# Patient Record
Sex: Male | Born: 1955 | Race: Black or African American | Hispanic: No | Marital: Single | Smoking: Never smoker
Health system: Southern US, Community
[De-identification: ages and names within clinical notes are randomized; demographics above are authoritative.]

## PROBLEM LIST (undated history)

## (undated) DIAGNOSIS — I10 Essential (primary) hypertension: Secondary | ICD-10-CM

## (undated) HISTORY — PX: KNEE SURGERY: SHX244

---

## 2003-11-27 ENCOUNTER — Ambulatory Visit (HOSPITAL_COMMUNITY): Admission: RE | Admit: 2003-11-27 | Discharge: 2003-11-27 | Payer: Self-pay | Admitting: Pulmonary Disease

## 2003-12-09 ENCOUNTER — Ambulatory Visit (HOSPITAL_COMMUNITY): Admission: RE | Admit: 2003-12-09 | Discharge: 2003-12-09 | Payer: Self-pay | Admitting: Pulmonary Disease

## 2003-12-15 ENCOUNTER — Emergency Department (HOSPITAL_COMMUNITY): Admission: EM | Admit: 2003-12-15 | Discharge: 2003-12-15 | Payer: Self-pay | Admitting: Emergency Medicine

## 2003-12-18 ENCOUNTER — Ambulatory Visit (HOSPITAL_COMMUNITY): Admission: RE | Admit: 2003-12-18 | Discharge: 2003-12-18 | Payer: Self-pay | Admitting: Orthopedic Surgery

## 2003-12-23 ENCOUNTER — Encounter (HOSPITAL_COMMUNITY): Admission: RE | Admit: 2003-12-23 | Discharge: 2004-01-22 | Payer: Self-pay | Admitting: Orthopedic Surgery

## 2004-01-25 ENCOUNTER — Encounter (HOSPITAL_COMMUNITY): Admission: RE | Admit: 2004-01-25 | Discharge: 2004-02-24 | Payer: Self-pay | Admitting: Orthopedic Surgery

## 2007-05-18 ENCOUNTER — Emergency Department (HOSPITAL_COMMUNITY): Admission: EM | Admit: 2007-05-18 | Discharge: 2007-05-18 | Payer: Self-pay | Admitting: Emergency Medicine

## 2011-01-22 NOTE — Op Note (Signed)
NAME:  William Barajas, William Barajas                        ACCOUNT NO.:  [id number]   MEDICAL RECORD NO.:  [id number]                   PATIENT TYPE:  AMB   LOCATION:  DAY                                  FACILITY:  [facility]   PHYSICIAN:  Vickki Hearing, M.D.           DATE OF BIRTH:  May 08, 1956   DATE OF PROCEDURE:  12/18/2003  DATE OF DISCHARGE:                                 OPERATIVE REPORT   PREOPERATIVE DIAGNOSIS:  Medial meniscal tear right knee.   POSTOPERATIVE DIAGNOSIS:  Medial meniscal tear right knee.   PROCEDURE:  1. Arthroscopy.  2. Medial meniscectomy, partial.   FINDINGS:  Torn medial meniscus.   SURGEON:  Vickki Hearing, M.D.   ANESTHETIC:  General.   DESCRIPTION OF PROCEDURE:  Mr. Mault was identified in the holding area.  His right knee was marked for surgery.  His consent and medical record were  reviewed.  He was given Ancef; taken to the operating room; and a general  anesthetic was administered.  His right knee was prepped and draped in a  sterile technique.   A time out was taken; everyone agreed with the procedure and extremity and  patient.  We proceeded with a diagnostic arthroscopic.  We viewed all  compartments of the knee; palpated all structures.  The medial meniscus was  torn.  A straight duckbill forceps was used to resect the tear.  The  meniscus was then balanced with a straight shaver.  We also used an  ArthroCare wand to contour the meniscus.   The knee was irrigated, suctioned, and the portals were closed with Steri-  Strips. A sterile dressing was applied along with the CryoCuff.  He was  extubated and taken to the recovery room in stable condition.   POSTOPERATIVE PLAN:  1. Full weightbearing.  2. Start physical therapy in 2-3 days.  3. Follow up in 2 days.      ___________________________________________                                            Vickki Hearing, M.D.   SEH/MEDQ  D:  12/19/2003  T:  12/19/2003  Job:   [id number]

## 2011-01-22 NOTE — H&P (Signed)
NAME:  William Barajas, PAREKH NO.:  [id number]   MEDICAL RECORD NO.:  [id number]                  PATIENT TYPE:   LOCATION:                                       FACILITY:   PHYSICIAN:  Vickki Hearing, M.D.           DATE OF BIRTH:  23-Sep-1955   DATE OF ADMISSION:  DATE OF DISCHARGE:                                HISTORY & PHYSICAL   CHIEF COMPLAINT:  Right knee pain.   This is a Short-Stay record for History and Physical for surgery.   MEDICAL HISTORY RELATED TO HIS ILLNESS:  This is a 55 year old male employee  of [employer], has atraumatic onset of right knee pain, swelling  and decreased function.  MRI was obtained which showed a torn medial  meniscus, some subcortical bony changes of the medial femoral condyle most  likely associated with arthritis, small joint effusion, grade 2  chondromalacia of the patella.  He presented for arthroscopy after failed  nonoperative treatment.   Other medical history, general medical history, review of systems negative.  NO ALLERGIES.  No medical problems or surgery.   MEDICATIONS:  Currently he is on  some hydrocodone and naproxen for this  illness.   FAMILY HISTORY:  Unrecorded.   FAMILY PHYSICIAN:  Dr. Juanetta Gosling.   SOCIAL HISTORY:  He is single, he does housekeeping.  He does not smoke, or  drink caffeine, __________.  Highest grade completed 11.   PHYSICAL EXAMINATION:  Weight is 160.  VITAL SIGNS:  Will be recorded at the time of surgery.  HEENT:  Reveals poor dentition and hygiene.  NECK:  Supple.  CHEST:  Clear.  HEART:  Rate and rhythm normal.  ABDOMEN:  Deferred.  RIGHT KNEE:  Range of motion 10-110, joint effusion noted, medial joint line  tenderness, positive screw home test.  Pain with external rotation of the  knee.  Knee ligaments are stable.  UPPER EXTREMITIES:  Are normal.   MRI FINDINGS:  As stated.   DIAGNOSIS:  Medial meniscal tear.   PLAN:  Arthroscopy right knee.   DATE OF SURGERY:  Wednesday, December 18, 2003.   FOLLOW UP:  Scheduled December 20, 2003.   PLANNED PROCEDURE CODE:  04540.   DIAGNOSIS CODES:  1. 717.2.  2. 715.16.     ___________________________________________                                         Vickki Hearing, M.D.   SEH/MEDQ  D:  12/16/2003  T:  12/16/2003  Job:  [id number]   cc:   Ramon Dredge L. Juanetta Gosling, M.D.  [address]  [place]  [place] [address]  Fax: [phone]   Day Surgery

## 2011-02-21 ENCOUNTER — Emergency Department (HOSPITAL_COMMUNITY)
Admission: EM | Admit: 2011-02-21 | Discharge: 2011-02-21 | Disposition: A | Discharge status: Home / Self Care | Payer: Commercial Managed Care - PPO | Attending: Emergency Medicine | Admitting: Emergency Medicine

## 2011-02-21 ENCOUNTER — Emergency Department (HOSPITAL_COMMUNITY): Payer: Commercial Managed Care - PPO

## 2011-02-21 DIAGNOSIS — M169 Osteoarthritis of hip, unspecified: Secondary | ICD-10-CM | POA: Insufficient documentation

## 2011-02-21 DIAGNOSIS — M1610 Unilateral primary osteoarthritis, unspecified hip: Secondary | ICD-10-CM | POA: Insufficient documentation

## 2011-02-24 ENCOUNTER — Other Ambulatory Visit (HOSPITAL_COMMUNITY): Payer: Self-pay | Admitting: Pulmonary Disease

## 2011-02-24 ENCOUNTER — Ambulatory Visit (HOSPITAL_COMMUNITY)
Admission: RE | Admit: 2011-02-24 | Discharge: 2011-02-24 | Disposition: A | Discharge status: Home / Self Care | Payer: 59 | Source: Ambulatory Visit | Attending: Pulmonary Disease | Admitting: Pulmonary Disease

## 2011-02-24 ENCOUNTER — Other Ambulatory Visit (HOSPITAL_COMMUNITY): Payer: Commercial Managed Care - PPO

## 2011-02-24 DIAGNOSIS — M79606 Pain in leg, unspecified: Secondary | ICD-10-CM

## 2011-02-24 DIAGNOSIS — M545 Low back pain, unspecified: Secondary | ICD-10-CM

## 2011-02-24 DIAGNOSIS — M25559 Pain in unspecified hip: Secondary | ICD-10-CM | POA: Insufficient documentation

## 2011-02-24 DIAGNOSIS — M79609 Pain in unspecified limb: Secondary | ICD-10-CM | POA: Insufficient documentation

## 2011-02-24 DIAGNOSIS — M5126 Other intervertebral disc displacement, lumbar region: Secondary | ICD-10-CM | POA: Insufficient documentation

## 2011-02-25 ENCOUNTER — Other Ambulatory Visit (HOSPITAL_COMMUNITY): Payer: Self-pay

## 2011-02-25 ENCOUNTER — Other Ambulatory Visit (HOSPITAL_COMMUNITY): Payer: Commercial Managed Care - PPO

## 2012-10-28 ENCOUNTER — Emergency Department (HOSPITAL_COMMUNITY): Payer: 59

## 2012-10-28 ENCOUNTER — Emergency Department (HOSPITAL_COMMUNITY)
Admission: EM | Admit: 2012-10-28 | Discharge: 2012-10-28 | Disposition: A | Discharge status: Home / Self Care | Payer: 59 | Attending: Emergency Medicine | Admitting: Emergency Medicine

## 2012-10-28 ENCOUNTER — Encounter (HOSPITAL_COMMUNITY): Payer: Self-pay | Admitting: Emergency Medicine

## 2012-10-28 DIAGNOSIS — M25519 Pain in unspecified shoulder: Secondary | ICD-10-CM | POA: Insufficient documentation

## 2012-10-28 DIAGNOSIS — Z9889 Other specified postprocedural states: Secondary | ICD-10-CM | POA: Insufficient documentation

## 2012-10-28 MED ORDER — HYDROCODONE-ACETAMINOPHEN 5-325 MG PO TABS: ORAL_TABLET | ORAL | Status: DC

## 2012-10-28 NOTE — ED Notes (Signed)
Patient c/o left shoulder pain that radiates down left arm. Per patient seen Dr Juanetta Gosling and given prednisone pack for pulled muscle, ion which he denies any improvement. Denies any chest pain or shortness of breath.

## 2012-10-28 NOTE — ED Provider Notes (Signed)
History     CSN: [id number]  Arrival date & time 10/28/12  1053   First MD Initiated Contact with Patient 10/28/12 1117      Chief Complaint  Patient presents with  . Shoulder Pain    (Consider location/radiation/quality/duration/timing/severity/associated sxs/prior treatment) HPI Comments: patient c/o pain to his left posterior shoulder for several days.  States that the pain radiates into his left upper arm as well.  Pain is worse with certain movements of the arm and improves with rest.  He states he saw his PMD for this 4 days ago and was given a prescription for prednisone which he has been taking , but has not improved the pain.  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  He denies neck pain, chest pain, shortness of breath, swelling , numbness or weakness to the extremity.    Patient is a 57 y.o. male presenting with shoulder pain. The history is provided by the patient.  Shoulder Pain This is a new problem. The current episode started 1 to 4 weeks ago. The problem occurs constantly. The problem has been unchanged. Associated symptoms include arthralgias. Pertinent negatives include no abdominal pain, chest pain, chills, congestion, coughing, diaphoresis, fever, headaches, joint swelling, myalgias, nausea, neck pain, numbness, rash, swollen glands, vomiting or weakness. The symptoms are aggravated by bending (certain movements and palaption). He has tried position changes (prednisone) for the symptoms. The treatment provided no relief.    History reviewed. No pertinent past medical history.  Past Surgical History  Procedure Laterality Date  . Knee surgery Right     Family History  Problem Relation Age of Onset  . Cancer Mother     History  Substance Use Topics  . Smoking status: Never Smoker   . Smokeless tobacco: Never Used  . Alcohol Use: No      Review of Systems  Constitutional: Negative for  fever, chills and diaphoresis.  HENT: Negative for congestion, neck pain and neck stiffness.   Respiratory: Negative for cough.   Cardiovascular: Negative for chest pain and palpitations.  Gastrointestinal: Negative for nausea, vomiting and abdominal pain.  Genitourinary: Negative for dysuria and difficulty urinating.  Musculoskeletal: Positive for arthralgias. Negative for myalgias, back pain and joint swelling.  Skin: Negative for color change, rash and wound.  Neurological: Negative for dizziness, syncope, facial asymmetry, weakness, numbness and headaches.  All other systems reviewed and are negative.    Allergies  Review of patient's allergies indicates no known allergies.  Home Medications   Current Outpatient Rx  Name  Route  Sig  Dispense  Refill  . predniSONE (DELTASONE) 10 MG tablet   Oral   Take 10-40 mg by mouth daily. Take 4 tablets for 3 days, then 3 tablets for 3 days, then 2 tablets for 3 days, then 1 tablet for 3 days. Started on 10/24/12.           BP 180/104  Pulse 79  Temp(Src) 97.3 F (36.3 C) (Oral)  Resp 16  Ht 5\' 6"  (1.676 m)  Wt 190 lb (86.183 kg)  BMI 30.68 kg/m2  SpO2 100%  Physical Exam  Nursing note and vitals reviewed. Constitutional: He is oriented to person, place, and time. He appears well-developed and well-nourished. No distress.  HENT:  Head: Normocephalic and atraumatic.  Neck: Normal range of motion. Neck supple. No thyromegaly present.  Cardiovascular: Normal rate, regular rhythm, normal heart  sounds and intact distal pulses.   No murmur heard. Pulmonary/Chest: Effort normal and breath sounds normal. No respiratory distress. He exhibits no tenderness.  Musculoskeletal: He exhibits tenderness. He exhibits no edema.  ttp of the left AC joint of the  Shoulder and tenderness along the muscular border along the scapula.  Pain with abduction of the left arm and rotation of the shoulder.  Radial pulse is brisk, distal sensation intact,  CR< 2 sec.  No abrasions, edema or bony deformity of the joint.   Lymphadenopathy:    He has no cervical adenopathy.  Neurological: He is alert and oriented to person, place, and time. He exhibits normal muscle tone. Coordination normal.  Skin: Skin is warm and dry.    ED Course  Procedures (including critical care time)  Labs Reviewed - No data to display Dg Shoulder Left  10/28/2012  *RADIOLOGY REPORT*  Clinical Data: Left shoulder pain.  LEFT SHOULDER - 2+ VIEW  Comparison: None.  Findings: No acute fracture or dislocation is identified.  Mild proliferative changes are seen involving the acromioclavicular joint with acromial spur present.  Soft tissues are unremarkable. No bony lesions or destruction.  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   Original Report Authenticated By: Irish Lack, M.D.         MDM  Vital stable, pt is well appearing.  Pain to shoulder reproduced with movement.  No erythema, excessive warmth or edema of the joint.   1300  Consulted Dr. Romeo Apple at patient's request.  Dr. Romeo Apple will see him in his office for f/u.      Will have the patient continue his prednisone taper and I will also prescribe norco #20 for better pain control.  Pain likely muscloskeletal, doubt neurological , infectious or cardiac related process    Nakshatra Klose L. Shannara Winbush, [place] 10/30/12 1708

## 2012-10-30 ENCOUNTER — Ambulatory Visit (INDEPENDENT_AMBULATORY_CARE_PROVIDER_SITE_OTHER): Payer: 59 | Admitting: Orthopedic Surgery

## 2012-10-30 ENCOUNTER — Encounter: Payer: Self-pay | Admitting: Orthopedic Surgery

## 2012-10-30 VITALS — BP 180/92 | Ht 66.0 in | Wt 197.4 lb

## 2012-10-30 DIAGNOSIS — M7550 Bursitis of unspecified shoulder: Secondary | ICD-10-CM | POA: Insufficient documentation

## 2012-10-30 DIAGNOSIS — M7552 Bursitis of left shoulder: Secondary | ICD-10-CM

## 2012-10-30 DIAGNOSIS — M67919 Unspecified disorder of synovium and tendon, unspecified shoulder: Secondary | ICD-10-CM

## 2012-10-30 DIAGNOSIS — M719 Bursopathy, unspecified: Secondary | ICD-10-CM

## 2012-10-30 HISTORY — DX: Bursitis of unspecified shoulder: M75.50

## 2012-10-30 NOTE — Progress Notes (Signed)
Patient ID: William Barajas, male   DOB: 05-12-1956, 57 y.o.   MRN: [id number] Chief Complaint  Patient presents with  . Shoulder Pain    left shoulder pain, no injury    Patient reports sudden onset of pain in his left shoulder on 10/24/2012 he saw his primary care physician and eventually went to the ER. He says the prednisone did help relieve some of the pain is also placed on hydrocodone after the ER visit  He complains of sharp 10 out of 10 constant pain worse in the morning worse with raising his arm associated with some radiation of pain into his elbow and swelling no trauma  Review of systems negative except for what was stated  BP 180/92  Ht 5\' 6"  (1.676 m)  Wt 197 lb 6.4 oz (89.54 kg)  BMI 31.88 kg/m2 Physical Exam(12)  Vital signs:   GENERAL: normal development   CDV: pulses are normal   Skin: normal  Lymph: nodes were not palpable/normal  Psychiatric: awake, alert and oriented  Neuro: normal sensation  MSK  Gait: Normal 1 Inspection cervical spine nontender left shoulder tender around the acromial area and deltoid 2 Range of Motion normal passive range of motion with pain at 120 of flexion throughout the ARC of 180 with a positive impingement sign 3 Motor normal cuff strength 4 Stability normal stability tests  Other side:  5 normal right shoulder range of motion 6 normal right shoulder strength  Imaging hospital film was normal except for some mild a.c. joint arthrosis report reviewed    Assessment: Right shoulder bursitis    Plan: Injection rest follow up as needed  Subacromial Shoulder Injection Procedure Note  Pre-operative Diagnosis: left RC Syndrome  Post-operative Diagnosis: same  Indications: pain   Anesthesia: ethyl chloride   Procedure Details   Verbal consent was obtained for the procedure. The shoulder was prepped withalcohol and the skin was anesthetized. A 20 gauge needle was advanced into the subacromial space through  posterior approach without difficulty  The space was then injected with 3 ml 1% lidocaine and 1 ml of depomedrol. The injection site was cleansed with isopropyl alcohol and a dressing was applied.  Complications:  None; patient tolerated the procedure well.

## 2012-10-30 NOTE — ED Provider Notes (Signed)
Medical screening examination/treatment/procedure(s) were performed by non-physician practitioner and as supervising physician I was immediately available for consultation/collaboration.   Laray Anger, DO 10/30/12 1940

## 2012-10-30 NOTE — Patient Instructions (Addendum)
Return to work on the 3rd of March Impingement Syndrome, Rotator Cuff, Bursitis with Rehab Impingement syndrome is a condition that involves inflammation of the tendons of the rotator cuff and the subacromial bursa, that causes pain in the shoulder. The rotator cuff consists of four tendons and muscles that control much of the shoulder and upper arm function. The subacromial bursa is a fluid filled sac that helps reduce friction between the rotator cuff and one of the bones of the shoulder (acromion). Impingement syndrome is usually an overuse injury that causes swelling of the bursa (bursitis), swelling of the tendon (tendonitis), and/or a tear of the tendon (strain). Strains are classified into three categories. Grade 1 strains cause pain, but the tendon is not lengthened. Grade 2 strains include a lengthened ligament, due to the ligament being stretched or partially ruptured. With grade 2 strains there is still function, although the function may be decreased. Grade 3 strains include a complete tear of the tendon or muscle, and function is usually impaired. SYMPTOMS   Pain around the shoulder, often at the outer portion of the upper arm.  Pain that gets worse with shoulder function, especially when reaching overhead or lifting.  Sometimes, aching when not using the arm.  Pain that wakes you up at night.  Sometimes, tenderness, swelling, warmth, or redness over the affected area.  Loss of strength.  Limited motion of the shoulder, especially reaching behind the back (to the back pocket or to unhook bra) or across your body.  Crackling sound (crepitation) when moving the arm.  Biceps tendon pain and inflammation (in the front of the shoulder). Worse when bending the elbow or lifting. CAUSES  Impingement syndrome is often an overuse injury, in which chronic (repetitive) motions cause the tendons or bursa to become inflamed. A strain occurs when a force is paced on the tendon or muscle that  is greater than it can withstand. Common mechanisms of injury include: Stress from sudden increase in duration, frequency, or intensity of training.  Direct hit (trauma) to the shoulder.  Aging, erosion of the tendon with normal use.  Bony bump on shoulder (acromial spur). RISK INCREASES WITH:  Contact sports (football, wrestling, boxing).  Throwing sports (baseball, tennis, volleyball).  Weightlifting and bodybuilding.  Heavy labor.  Previous injury to the rotator cuff, including impingement.  Poor shoulder strength and flexibility.  Failure to warm up properly before activity.  Inadequate protective equipment.  Old age.  Bony bump on shoulder (acromial spur). PREVENTION   Warm up and stretch properly before activity.  Allow for adequate recovery between workouts.  Maintain physical fitness:  Strength, flexibility, and endurance.  Cardiovascular fitness.  Learn and use proper exercise technique. PROGNOSIS  If treated properly, impingement syndrome usually goes away within 6 weeks. Sometimes surgery is required.  RELATED COMPLICATIONS   Longer healing time if not properly treated, or if not given enough time to heal.  Recurring symptoms, that result in a chronic condition.  Shoulder stiffness, frozen shoulder, or loss of motion.  Rotator cuff tendon tear.  Recurring symptoms, especially if activity is resumed too soon, with overuse, with a direct blow, or when using poor technique. TREATMENT  Treatment first involves the use of ice and medicine, to reduce pain and inflammation. The use of strengthening and stretching exercises may help reduce pain with activity. These exercises may be performed at home or with a therapist. If non-surgical treatment is unsuccessful after more than 6 months, surgery may be advised. After surgery  and rehabilitation, activity is usually possible in 3 months.  MEDICATION  If pain medicine is needed, nonsteroidal anti-inflammatory  medicines (aspirin and ibuprofen), or other minor pain relievers (acetaminophen), are often advised.  Do not take pain medicine for 7 days before surgery.  Prescription pain relievers may be given, if your caregiver thinks they are needed. Use only as directed and only as much as you need.  Corticosteroid injections may be given by your caregiver. These injections should be reserved for the most serious cases, because they may only be given a certain number of times. HEAT AND COLD  Cold treatment (icing) should be applied for 10 to 15 minutes every 2 to 3 hours for inflammation and pain, and immediately after activity that aggravates your symptoms. Use ice packs or an ice massage.  Heat treatment may be used before performing stretching and strengthening activities prescribed by your caregiver, physical therapist, or athletic trainer. Use a heat pack or a warm water soak. SEEK MEDICAL CARE IF:   Symptoms get worse or do not improve in 4 to 6 weeks, despite treatment.  New, unexplained symptoms develop. (Drugs used in treatment may produce side effects.)

## 2012-11-01 ENCOUNTER — Telehealth: Payer: Self-pay | Admitting: Orthopedic Surgery

## 2012-11-01 NOTE — Telephone Encounter (Signed)
Patient advised.

## 2012-11-01 NOTE — Telephone Encounter (Signed)
Just rest the arm apply ice should be better by tomorrow

## 2012-11-02 ENCOUNTER — Telehealth: Payer: Self-pay | Admitting: Orthopedic Surgery

## 2012-11-02 ENCOUNTER — Other Ambulatory Visit: Payer: Self-pay | Admitting: Orthopedic Surgery

## 2012-11-02 ENCOUNTER — Encounter: Payer: Self-pay | Admitting: Orthopedic Surgery

## 2012-11-02 DIAGNOSIS — M25512 Pain in left shoulder: Secondary | ICD-10-CM

## 2012-11-02 MED ORDER — IBUPROFEN 800 MG PO TABS: 800.0000 mg | ORAL_TABLET | Freq: Three times a day (TID) | ORAL | Status: DC | PRN

## 2012-11-02 MED ORDER — HYDROCODONE-ACETAMINOPHEN 7.5-325 MG PO TABS: 1.0000 | ORAL_TABLET | ORAL | Status: DC | PRN

## 2012-11-02 NOTE — Telephone Encounter (Signed)
Patient came by office and talked with Dr. Romeo Apple

## 2012-11-02 NOTE — Telephone Encounter (Signed)
Patient called back this morning to relay that his shoulder is not any better.  Phone note from yesterday, 11/01/12 indicates he was to ice, rest, which he said he had done.  Complains of it hurting a lot at night.  Please advise.   Ph# [phone] (Home)

## 2012-11-07 ENCOUNTER — Ambulatory Visit: Payer: 59 | Admitting: Orthopedic Surgery

## 2012-11-08 ENCOUNTER — Encounter: Payer: Self-pay | Admitting: Orthopedic Surgery

## 2012-11-08 ENCOUNTER — Ambulatory Visit (INDEPENDENT_AMBULATORY_CARE_PROVIDER_SITE_OTHER): Payer: 59 | Admitting: Orthopedic Surgery

## 2012-11-08 VITALS — BP 160/98 | Ht 66.0 in | Wt 197.0 lb

## 2012-11-08 DIAGNOSIS — G5602 Carpal tunnel syndrome, left upper limb: Secondary | ICD-10-CM

## 2012-11-08 DIAGNOSIS — M67919 Unspecified disorder of synovium and tendon, unspecified shoulder: Secondary | ICD-10-CM

## 2012-11-08 DIAGNOSIS — M7552 Bursitis of left shoulder: Secondary | ICD-10-CM

## 2012-11-08 DIAGNOSIS — G5600 Carpal tunnel syndrome, unspecified upper limb: Secondary | ICD-10-CM

## 2012-11-08 HISTORY — DX: Carpal tunnel syndrome, left upper limb: G56.02

## 2012-11-08 NOTE — Patient Instructions (Addendum)
Change note to RTW 14th light duty for 1 weeks; then regular duty  If no light duty the out till 21st   Wear brace at night

## 2012-11-08 NOTE — Progress Notes (Signed)
Patient ID: William Barajas, male   DOB: [birth year], 57 y.o.   MRN: [id number] Chief Complaint  Patient presents with  . Follow-up    Recheck left shoulder.  . Numbness    Left hand fingertips    BP 160/98  Ht 5\' 6"  (1.676 m)  Wt 197 lb (89.359 kg)  BMI 31.81 kg/m2  History Mr. Franco Collet has pain and swelling in his left shoulder decrease range of motion was treated with a steroid Dosepak plus injection and a sling to rest his shoulder he reports improvement in his range of motion and decrease pain. He is also on hydrocodone 7.5 mg increased from 5 mg  He denies any neck pain  He does complain of pain and paresthesias in the fingertips of his left hand worse at night. He denies power grip activities difficulty  Review of systems no vascular symptoms no other neurologic symptoms no other joint pain  Examination vitals as recorded stable. General appearance normal. Oriented x3 mood and affect normal ambulation normal. Range of motion left shoulder improved to near normal mild pain no crepitance shoulder stable rotator cuff strength normal skin intact no tenderness good distal pulses some decreased sensation at the fingertips  Impression  New problem carpal tunnel syndrome  Established problem improved bursitis Plan continue rest the shoulder splint at night for carpal tunnel returned to work on the 14th continue current pain medication  Carpal tunnel syndrome on left  Bursitis, shoulder, left

## 2012-11-20 ENCOUNTER — Other Ambulatory Visit: Payer: Self-pay | Admitting: *Deleted

## 2012-11-20 DIAGNOSIS — M25512 Pain in left shoulder: Secondary | ICD-10-CM

## 2012-11-20 MED ORDER — IBUPROFEN 800 MG PO TABS: 800.0000 mg | ORAL_TABLET | Freq: Three times a day (TID) | ORAL | Status: DC | PRN

## 2012-11-22 ENCOUNTER — Ambulatory Visit (INDEPENDENT_AMBULATORY_CARE_PROVIDER_SITE_OTHER): Payer: 59 | Admitting: Orthopedic Surgery

## 2012-11-22 ENCOUNTER — Encounter: Payer: Self-pay | Admitting: Orthopedic Surgery

## 2012-11-22 VITALS — BP 170/92 | Ht 66.0 in | Wt 197.0 lb

## 2012-11-22 DIAGNOSIS — M7552 Bursitis of left shoulder: Secondary | ICD-10-CM

## 2012-11-22 DIAGNOSIS — M67919 Unspecified disorder of synovium and tendon, unspecified shoulder: Secondary | ICD-10-CM

## 2012-11-22 DIAGNOSIS — M719 Bursopathy, unspecified: Secondary | ICD-10-CM

## 2012-11-22 NOTE — Patient Instructions (Signed)
You have received a steroid shot. 15% of patients experience increased pain at the injection site with in the next 24 hours. This is best treated with ice and tylenol extra strength 2 tabs every 8 hours. If you are still having pain please call the office.   Call hospital to arrange PT   Extend OOW x 2 weeks

## 2012-11-22 NOTE — Progress Notes (Signed)
Patient ID: William Barajas, male   DOB: [birth year], 57 y.o.   MRN: [id number] Chief Complaint  Patient presents with  . Follow-up    left shoulder    BP 170/92  Ht 5\' 6"  (1.676 m)  Wt 197 lb (89.359 kg)  BMI 31.81 kg/m2  Left shoulder bursitis  The patient comes in followup after injection and oral analgesics for persistent left shoulder pain we also took him out of work and placed in a sling he may improvement in the shoulder wrist and says he still can go to work because it hurts too much. His pain seemed to be much improved he can move his shoulder much better  He reports some radiation of pain into his elbow but no paresthesias in the arm and he denies neck pain  General appearance is normal, the patient is alert and oriented x3 with normal mood and affect.  Passive range of motion of the shoulder is normal except for extremes of motion which causes pain including impingement syndrome his rotator cuff strength is grade 5 the shoulder is stable neurovascular exam is intact and he is still somewhat tender in the posterior joint line and subacromial space  Repeat injections or physical therapy continue out of work for 2 more weeks and see me again with anticipation of return to work.  Subacromial Shoulder Injection Procedure Note  Pre-operative Diagnosis: left RC Syndrome  Post-operative Diagnosis: same  Indications: pain   Anesthesia: ethyl chloride   Procedure Details   Verbal consent was obtained for the procedure. The shoulder was prepped withalcohol and the skin was anesthetized. A 20 gauge needle was advanced into the subacromial space through posterior approach without difficulty  The space was then injected with 3 ml 1% lidocaine and 1 ml of depomedrol. The injection site was cleansed with isopropyl alcohol and a dressing was applied.  Complications:  None; patient tolerated the procedure well.

## 2012-12-01 ENCOUNTER — Ambulatory Visit (HOSPITAL_COMMUNITY)
Admission: RE | Admit: 2012-12-01 | Discharge: 2012-12-01 | Disposition: A | Discharge status: Home / Self Care | Payer: 59 | Source: Ambulatory Visit | Attending: Orthopedic Surgery | Admitting: Orthopedic Surgery

## 2012-12-01 DIAGNOSIS — M6281 Muscle weakness (generalized): Secondary | ICD-10-CM | POA: Insufficient documentation

## 2012-12-01 DIAGNOSIS — M75102 Unspecified rotator cuff tear or rupture of left shoulder, not specified as traumatic: Secondary | ICD-10-CM

## 2012-12-01 DIAGNOSIS — M25519 Pain in unspecified shoulder: Secondary | ICD-10-CM | POA: Insufficient documentation

## 2012-12-01 DIAGNOSIS — M25619 Stiffness of unspecified shoulder, not elsewhere classified: Secondary | ICD-10-CM | POA: Insufficient documentation

## 2012-12-01 DIAGNOSIS — M7552 Bursitis of left shoulder: Secondary | ICD-10-CM

## 2012-12-01 DIAGNOSIS — IMO0001 Reserved for inherently not codable concepts without codable children: Secondary | ICD-10-CM | POA: Insufficient documentation

## 2012-12-01 HISTORY — DX: Muscle weakness (generalized): M62.81

## 2012-12-01 HISTORY — DX: Pain in unspecified shoulder: M25.519

## 2012-12-01 HISTORY — DX: Unspecified rotator cuff tear or rupture of left shoulder, not specified as traumatic: M75.102

## 2012-12-01 NOTE — Evaluation (Signed)
Occupational Therapy Evaluation  Patient Details  Name: William Barajas MRN: [id number] Date of Birth: 1955/12/23  Today's Date: 12/01/2012 Time: 4098-1191 OT Time Calculation (min): 35 min OT Evaluation [phone] 20' Manual Therapy 1000-1015 15' Visit#: 1 of 12  Re-eval: 12/29/12   Diagnosis: Left Shoulder Rotator Cuff Syndrome and Bursitis  Next MD Visit: 12/07/12 Prior Therapy: n/a  Past Medical History: No past medical history on file. Past Surgical History:  Past Surgical History  Procedure Laterality Date  . Knee surgery Right     Subjective S:  I want to be able to go back to work. Pertinent History: William Barajas states that he woke up on 10/24/12 with extreme pain in his left shoulder.  He consulted with William Barajas and was prescribed pain medication.  His pain did not improve and he presented to the ED on 10/28/12.  He followed up with William Barajas on 10/30/12 and received a cortisone injection.  He returned to William Barajas on 11/22/12 and received a second injection.   He has been referred to occupational therapy for evaluation and treatment.   Special Tests: DASH scored 40.9 with ideal score being 0 Patient Stated Goals: I want to use it like normal and get back to work.  Pain Assessment Currently in Pain?: Yes Pain Score:   7 Pain Location: Shoulder Pain Orientation: Left Pain Type: Acute pain  Precautions/Restrictions   n/a  Balance Screening Balance Screen Has the patient fallen in the past 6 months: No Has the patient had a decrease in activity level because of a fear of falling? : No Is the patient reluctant to leave their home because of a fear of falling? : No  Prior Functioning  Home Living Lives With: Family Prior Function Driving: Yes Vocation: Full time employment Vocation Requirements: housekeeping at [employer] Comments: William Barajas enjoys piddling around the house  Assessment ADL/Vision/Perception ADL ADL Comments: William Barajas is not able  to use his left arm to reach behind his head or back, lift anything heavy, and is not working due the pain in his shoulder Dominant Hand: Right  Cognition/Observation Cognition Overall Cognitive Status: Appears within functional limits for tasks assessed  Sensation/Coordination/Edema Sensation Light Touch: Appears Intact Coordination Gross Motor Movements are Fluid and Coordinated: Yes Fine Motor Movements are Fluid and Coordinated: Yes  Additional Assessments LUE AROM (degrees) LUE Overall AROM Comments: assessed in seated.  External rotation and internal rotation with shoulder abducted Left Shoulder Flexion: 95 Degrees Left Shoulder ABduction: 130 Degrees Left Shoulder Internal Rotation: 50 Degrees Left Shoulder External Rotation: 30 Degrees LUE PROM (degrees) LUE Overall PROM Comments: assessed in seated.  External rotation and internal rotation with shoulder abducted Left Shoulder Flexion: 125 Degrees Left Shoulder ABduction: 160 Degrees Left Shoulder Internal Rotation: 70 Degrees Left Shoulder External Rotation: 50 Degrees LUE Strength Left Shoulder Flexion: 4/5 Left Shoulder ABduction: 4/5 Left Shoulder Internal Rotation: 4/5 Left Shoulder External Rotation: 4/5 Palpation Palpation: mod-max fascial restrictions noted in his left shoulder region     Exercise/Treatments    Manual Therapy Manual Therapy: Myofascial release Myofascial Release: Myofascial release and stretching to left upper arm, scapular, and shoulder region to decrease pain and restrictions and improve pain free mobility and strength.  Occupational Therapy Assessment and Plan OT Assessment and Plan Clinical Impression Statement: A:  Patient is a 57 year old male with decreased AROM and strength and increased pain and fascial restrictions in his left shoulder.  These deficits are impeding his ability to complete  desired daily activities and to work.  Pt will benefit from skilled therapeutic intervention  in order to improve on the following deficits: Decreased strength;Decreased range of motion;Increased muscle spasms;Increased fascial restricitons;Pain Rehab Potential: Good OT Frequency: Min 2X/week OT Duration: 6 weeks OT Treatment/Interventions: Self-care/ADL training;Therapeutic exercise;Manual therapy;Modalities;Patient/family education OT Plan: P:  Skilled OT intervention to decrease pain and fascial restrictions and increase pain free mobility in his left shoulder region in order to return to prior level of I with all B/IADLs, work, and leisure activities.  Treatment Plan:  MFR and manual stretching  AAROM, AROM, seated  AROM of elev, extension, retraction.  ball stretches, x to v, w arms, wall wash, progress as tolerated.    Goals Short Term Goals Time to Complete Short Term Goals: 3 weeks Short Term Goal 1: Patient will be educated on a HEP. Short Term Goal 2: Patient will increase left shoulder PROM to [facility] for increased ability to don shirts with less pain.  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 Short Term Goal 4: Patient will decrease pain in his left shoulder to 4/10 when getting dressed. Short Term Goal 5: Patient will decrease fascial restrictions in his left shoulder to moderate for increased functional mobility with daily activities.  Long Term Goals Time to Complete Long Term Goals: 6 weeks Long Term Goal 1: Patient will return to prior level of function with all daily, work, and leisure activities.  Long Term Goal 2: Patient will increase left shoulder AROM to [facility] for increased ability to reach into overhead cabinets at work.  Long Term Goal 3: Patient will increase left shoulder strength to 5/5 for increased ability to lift bags of linen at work. Long Term Goal 4: Patient will decrease pain in his left shoulder to 2/10 when getting dressed. Long Term Goal 5: Patient will decrease  fascial restrictions in his left shoulder to minimal for increased functional mobility with daily activities.   Problem List Patient Active Problem List  Diagnosis  . Bursitis, shoulder  . Carpal tunnel syndrome on left  . Rotator cuff syndrome of left shoulder  . Pain in joint, shoulder region  . Muscle weakness (generalized)    End of Session Activity Tolerance: Patient tolerated treatment well General Behavior During Session: [facility] for tasks performed Cognition: [facility] for tasks performed OT Plan of Care OT Home Exercise Plan: Dowel rod and shoulder stretches.  Consulted and Agree with Plan of Care: Patient  GO    Shirlean Mylar, OTR/L  12/01/2012, 2:16 PM  Physician Documentation Your signature is required to indicate approval of the treatment plan as stated above.  Please sign and either send electronically or make a copy of this report for your files and return this physician signed original.  Please mark one 1.__approve of plan  2. ___approve of plan with the following conditions.   ______________________________                                                          _____________________ Physician Signature  Date  

## 2012-12-04 ENCOUNTER — Ambulatory Visit (HOSPITAL_COMMUNITY)
Admission: RE | Admit: 2012-12-04 | Discharge: 2012-12-04 | Disposition: A | Discharge status: Home / Self Care | Payer: 59 | Source: Ambulatory Visit | Attending: Pulmonary Disease | Admitting: Pulmonary Disease

## 2012-12-04 DIAGNOSIS — M25512 Pain in left shoulder: Secondary | ICD-10-CM

## 2012-12-04 DIAGNOSIS — M75102 Unspecified rotator cuff tear or rupture of left shoulder, not specified as traumatic: Secondary | ICD-10-CM

## 2012-12-04 DIAGNOSIS — M6281 Muscle weakness (generalized): Secondary | ICD-10-CM

## 2012-12-04 NOTE — Progress Notes (Signed)
Occupational Therapy Treatment Patient Details  Name: William Barajas MRN: [id number] Date of Birth: 1956/09/03  Today's Date: 12/04/2012 Time: 8469-6295 OT Time Calculation (min): 30 min Manual Therapy 104-119 15' Therapeutic Exercise 120-134 14'  Visit#: 2 of 12  Re-eval: 12/29/12     Subjective Symptoms/Limitations Symptoms: S:  My shoulder is doing ok I guess. Pain Assessment Currently in Pain?: Yes Pain Score:   5 Pain Location: Shoulder Pain Orientation: Left Pain Type: Acute pain  Precautions/Restrictions     Exercise/Treatments Supine Protraction: PROM;AAROM;10 reps Horizontal ABduction: PROM;AAROM;10 reps External Rotation: PROM;AAROM;10 reps Internal Rotation: PROM;AAROM;10 reps Flexion: PROM;AAROM;10 reps ABduction: PROM;AAROM;10 reps Seated Elevation: AROM;10 reps Extension: AROM;10 reps Row: AROM;10 reps Therapy Ball Flexion: 15 reps ABduction: 15 reps       Manual Therapy Myofascial Release: Myofascial release and stretching to left upper arm,, scapular and shoulder region to decrease pain and restrictions and improve pain free mobility and strength.  Occupational Therapy Assessment and Plan OT Assessment and Plan Clinical Impression Statement: A:  Added AAROM in supine, scapular AROM seated and ball stretches.  Pt. with 90% of AAROM. OT Plan: P:  Switch from AAROM supine to AROM, attempt AROM seated if unable to get good AROM seated use AAROM.   Goals Short Term Goals Time to Complete Short Term Goals: 3 weeks Short Term Goal 1: Patient will be educated on a HEP. Short Term Goal 2: Patient will increase left shoulder PROM to [facility] for increased ability to don shirts with less pain.  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 Short Term Goal 4: Patient will decrease pain in his left shoulder to 4/10 when getting dressed. Short Term Goal 5: Patient will  decrease fascial restrictions in his left shoulder to moderate for increased functional mobility with daily activities.  Long Term Goals Time to Complete Long Term Goals: 6 weeks Long Term Goal 1: Patient will return to prior level of function with all daily, work, and leisure activities.  Long Term Goal 2: Patient will increase left shoulder AROM to [facility] for increased ability to reach into overhead cabinets at work.  Long Term Goal 3: Patient will increase left shoulder strength to 5/5 for increased ability to lift bags of linen at work. Long Term Goal 4: Patient will decrease pain in his left shoulder to 2/10 when getting dressed. Long Term Goal 5: Patient will decrease fascial restrictions in his left shoulder to minimal for increased functional mobility with daily activities.   Problem List Patient Active Problem List  Diagnosis  . Bursitis, shoulder  . Carpal tunnel syndrome on left  . Rotator cuff syndrome of left shoulder  . Pain in joint, shoulder region  . Muscle weakness (generalized)    End of Session Activity Tolerance: Patient tolerated treatment well General Behavior During Session: [facility] for tasks performed Cognition: [facility] for tasks performed  GO   Broderic Bara L. Doak Mah, COTA/L 12/04/2012, 1:38 PM

## 2012-12-06 ENCOUNTER — Ambulatory Visit (HOSPITAL_COMMUNITY)
Admission: RE | Admit: 2012-12-06 | Discharge: 2012-12-06 | Disposition: A | Discharge status: Home / Self Care | Payer: 59 | Source: Ambulatory Visit | Attending: Pulmonary Disease | Admitting: Pulmonary Disease

## 2012-12-06 DIAGNOSIS — M6281 Muscle weakness (generalized): Secondary | ICD-10-CM | POA: Insufficient documentation

## 2012-12-06 DIAGNOSIS — M25619 Stiffness of unspecified shoulder, not elsewhere classified: Secondary | ICD-10-CM | POA: Insufficient documentation

## 2012-12-06 DIAGNOSIS — M25519 Pain in unspecified shoulder: Secondary | ICD-10-CM | POA: Insufficient documentation

## 2012-12-06 DIAGNOSIS — M75102 Unspecified rotator cuff tear or rupture of left shoulder, not specified as traumatic: Secondary | ICD-10-CM

## 2012-12-06 DIAGNOSIS — M25512 Pain in left shoulder: Secondary | ICD-10-CM

## 2012-12-06 DIAGNOSIS — IMO0001 Reserved for inherently not codable concepts without codable children: Secondary | ICD-10-CM | POA: Insufficient documentation

## 2012-12-06 NOTE — Progress Notes (Signed)
Occupational Therapy Treatment Patient Details  Name: William Barajas MRN: [id number] Date of Birth: [birth year]  Today's Date: 12/06/2012 Time: 1019-1101 OT Time Calculation (min): 42 min Manual Therapy 1019-1041 22'; Therapeutic Exercise [phone] 19'  Visit#: 3 of 12  Re-eval: 12/29/12    Authorization:    Authorization Time Period:    Authorization Visit#:   of    Subjective Symptoms/Limitations Symptoms: S:  It is just a little sore, it has been tingling down to my fingers.  Precautions/Restrictions     Exercise/Treatments Supine Protraction: PROM;AAROM;12 reps Horizontal ABduction: PROM;AAROM;12 reps External Rotation: PROM;AAROM;12 reps Internal Rotation: PROM;AAROM;10 reps Flexion: PROM;AAROM;12 reps ABduction: PROM;AAROM;12 reps Seated Elevation: AROM;10 reps Extension: AROM;10 reps Row: AROM;10 reps Therapy Ball Flexion: 20 reps ABduction: 20 reps Right/Left: 5 reps Stretches Corner Stretch: 5 reps;10 seconds     Manual Therapy Manual Therapy: Myofascial release Myofascial Release: Myofascial release and stretching to left upper arm, scapular and shoulder region to decrease pain and restrictions and improve pain free mobility and strength.  Occupational Therapy Assessment and Plan OT Assessment and Plan Clinical Impression Statement: A:  Added corner stretch.  Cues needed with scapular AROM to keep shoulder depressed with extension and retraction.  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 OT Plan: P:  Add wall wash to increase AROM   Goals Short Term Goals Time to Complete Short Term Goals: 3 weeks Short Term Goal 1: Patient will be educated on a HEP. Short Term Goal 2: Patient will increase left shoulder PROM to [facility] for increased ability to don shirts with less pain.  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 Short Term Goal 4: Patient will decrease  pain in his left shoulder to 4/10 when getting dressed. Short Term Goal 5: Patient will decrease fascial restrictions in his left shoulder to moderate for increased functional mobility with daily activities.  Long Term Goals Time to Complete Long Term Goals: 6 weeks Long Term Goal 1: Patient will return to prior level of function with all daily, work, and leisure activities.  Long Term Goal 2: Patient will increase left shoulder AROM to [facility] for increased ability to reach into overhead cabinets at work.  Long Term Goal 3: Patient will increase left shoulder strength to 5/5 for increased ability to lift bags of linen at work. Long Term Goal 4: Patient will decrease pain in his left shoulder to 2/10 when getting dressed. Long Term Goal 5: Patient will decrease fascial restrictions in his left shoulder to minimal for increased functional mobility with daily activities.   Problem List Patient Active Problem List  Diagnosis  . Bursitis, shoulder  . Carpal tunnel syndrome on left  . Rotator cuff syndrome of left shoulder  . Pain in joint, shoulder region  . Muscle weakness (generalized)    End of Session Activity Tolerance: Patient tolerated treatment well General Behavior During Session: [facility] for tasks performed Cognition: [facility] for tasks performed  GO   Yaneth Fairbairn L. Coley Kulikowski, COTA/L 12/06/2012, 2:02 PM

## 2012-12-07 ENCOUNTER — Ambulatory Visit (INDEPENDENT_AMBULATORY_CARE_PROVIDER_SITE_OTHER): Payer: 59 | Admitting: Orthopedic Surgery

## 2012-12-07 ENCOUNTER — Encounter: Payer: Self-pay | Admitting: Orthopedic Surgery

## 2012-12-07 VITALS — BP 150/100 | Ht 66.0 in | Wt 197.0 lb

## 2012-12-07 DIAGNOSIS — M75102 Unspecified rotator cuff tear or rupture of left shoulder, not specified as traumatic: Secondary | ICD-10-CM

## 2012-12-07 DIAGNOSIS — S43429A Sprain of unspecified rotator cuff capsule, initial encounter: Secondary | ICD-10-CM

## 2012-12-07 NOTE — Patient Instructions (Addendum)
OOW NOTE X 4 WEEKS  REFER FOR NERVE CONDUCTION STUDY BOTH WRIST

## 2012-12-07 NOTE — Progress Notes (Signed)
Patient ID: NASIF BOS, male   DOB: 03-04-1956, 57 y.o.   MRN: [id number] Chief Complaint  Patient presents with  . Follow-up    2 week recheck left shoulder following injection   The patient is still having left shoulder pain and pain with forward elevation. He now complains of some numbness and tingling in his hands but he denies neck pain or stiffness  Review of systems no weakness in the left upper extremity.  Exam BP 150/100  Ht 5\' 6"  (1.676 m)  Wt 197 lb (89.359 kg)  BMI 31.81 kg/m2 General appearance is normal, the patient is alert and oriented x3 with normal mood and affect. For elevation causes pain with a positive impingement sign the weakness in the left supraspinatus tendon is mildly scans intact shoulder is stable  His neck is tender his pulses are good he has decreased sensation at the tips of his fingers  Quite sure that he does have impingement syndrome a notcher of his cuff tear  Recommend MRI after failed injection twice, anti-inflammatories, physical therapy, hydrocodone.  I am  looking for a rotator cuff tear to evaluate for possible surgical intervention

## 2012-12-08 ENCOUNTER — Telehealth: Payer: Self-pay | Admitting: *Deleted

## 2012-12-08 ENCOUNTER — Other Ambulatory Visit: Payer: Self-pay | Admitting: *Deleted

## 2012-12-08 DIAGNOSIS — G5600 Carpal tunnel syndrome, unspecified upper limb: Secondary | ICD-10-CM

## 2012-12-08 NOTE — Telephone Encounter (Signed)
Faxed referral and office notes to Dr. [place]on[place]ah. Awaiting appointment. 

## 2012-12-12 ENCOUNTER — Ambulatory Visit (HOSPITAL_COMMUNITY)
Admission: RE | Admit: 2012-12-12 | Discharge: 2012-12-12 | Disposition: A | Discharge status: Home / Self Care | Payer: 59 | Source: Ambulatory Visit | Attending: Orthopedic Surgery | Admitting: Orthopedic Surgery

## 2012-12-12 DIAGNOSIS — M25512 Pain in left shoulder: Secondary | ICD-10-CM

## 2012-12-12 DIAGNOSIS — M6281 Muscle weakness (generalized): Secondary | ICD-10-CM

## 2012-12-12 DIAGNOSIS — M75102 Unspecified rotator cuff tear or rupture of left shoulder, not specified as traumatic: Secondary | ICD-10-CM

## 2012-12-12 NOTE — Progress Notes (Signed)
Occupational Therapy Treatment Patient Details  Name: William Barajas MRN: [id number] Date of Birth: 01/23/1956  Today's Date: 12/12/2012 Time: 0160-1093 OT Time Calculation (min): 47 min MFR 1440-1450 10' Therex 2355-7322 37'  Visit#: 4 of 12  Re-eval: 12/29/12    Authorization:    Authorization Time Period:    Authorization Visit#:   of    Subjective Symptoms/Limitations Symptoms: S: I still feel the tingling in my arm and down my fingers. 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  Occupational Therapy Assessment and Plan OT Assessment and Plan Clinical Impression Statement: A: Added wall wash and tolerated well. Cues to depress shoulder during exercises. Rehab tech completed exercises. OT Plan: P: Work on depressing shoulder during all exercises. Add thumb tacks.   Goals Short Term Goals Time to Complete Short Term Goals: 3 weeks Short Term Goal 1: Patient will be educated on a HEP. Short Term Goal 1 Progress: Progressing toward goal Short Term Goal 2: Patient will increase left shoulder PROM to [facility] for increased ability to don shirts with less pain.  Short Term Goal 2 Progress:  Progressing toward goal Short Term Goal 3: Patient will increase left shoulder strength to 4+/5 for increased ability to push the vacuum cleaner at work. Short Term Goal 3 Progress: Progressing toward goal Short Term Goal 4: Patient will decrease pain in his left shoulder to 4/10 when getting dressed. Short Term Goal 4 Progress: Progressing toward goal Short Term Goal 5: Patient will decrease fascial restrictions in his left shoulder to moderate for increased functional mobility with daily activities.  Short Term Goal 5 Progress: Progressing toward goal Long Term Goals Time to Complete Long Term Goals: 6 weeks Long Term Goal 1: Patient will return to prior level of function with all daily, work, and leisure activities.  Long Term Goal 1 Progress: Progressing toward goal Long Term Goal 2: Patient will increase left shoulder AROM to [facility] for increased ability to reach into overhead cabinets at work.  Long Term Goal 2 Progress: Progressing toward goal Long Term Goal 3: Patient will increase left shoulder strength to 5/5 for increased ability to lift bags of linen at work. Long Term Goal 3 Progress: Progressing toward goal Long Term Goal 4: Patient will decrease pain in his left shoulder to 2/10 when getting dressed. Long Term Goal 4 Progress: Progressing toward goal Long Term Goal 5: Patient will decrease fascial restrictions in his left shoulder to minimal for increased functional mobility with daily activities.  Long Term Goal 5 Progress: Progressing toward goal  Problem List Patient Active Problem List  Diagnosis  . Bursitis, shoulder  . Carpal tunnel syndrome on left  . Rotator cuff syndrome of left shoulder  . Pain in joint, shoulder region  . Muscle weakness (generalized)    End of Session Activity Tolerance: Patient tolerated treatment well General Behavior During Session: [facility] for tasks performed Cognition: [facility] for  tasks performed   Limmie Patricia, OTR/L,CBIS   12/12/2012,  4:16 PM

## 2012-12-13 ENCOUNTER — Ambulatory Visit (HOSPITAL_COMMUNITY)
Admission: RE | Admit: 2012-12-13 | Discharge: 2012-12-13 | Disposition: A | Discharge status: Home / Self Care | Payer: 59 | Source: Ambulatory Visit | Attending: Orthopedic Surgery | Admitting: Orthopedic Surgery

## 2012-12-13 DIAGNOSIS — M67919 Unspecified disorder of synovium and tendon, unspecified shoulder: Secondary | ICD-10-CM | POA: Insufficient documentation

## 2012-12-13 DIAGNOSIS — M75102 Unspecified rotator cuff tear or rupture of left shoulder, not specified as traumatic: Secondary | ICD-10-CM

## 2012-12-13 DIAGNOSIS — M719 Bursopathy, unspecified: Secondary | ICD-10-CM | POA: Insufficient documentation

## 2012-12-13 DIAGNOSIS — M25519 Pain in unspecified shoulder: Secondary | ICD-10-CM | POA: Insufficient documentation

## 2012-12-14 ENCOUNTER — Ambulatory Visit (INDEPENDENT_AMBULATORY_CARE_PROVIDER_SITE_OTHER): Payer: 59 | Admitting: Orthopedic Surgery

## 2012-12-14 ENCOUNTER — Inpatient Hospital Stay (HOSPITAL_COMMUNITY): Admission: RE | Admit: 2012-12-14 | Payer: 59 | Source: Ambulatory Visit | Admitting: Occupational Therapy

## 2012-12-14 ENCOUNTER — Encounter: Payer: Self-pay | Admitting: Orthopedic Surgery

## 2012-12-14 VITALS — BP 178/102 | Ht 66.0 in | Wt 197.0 lb

## 2012-12-14 DIAGNOSIS — M67919 Unspecified disorder of synovium and tendon, unspecified shoulder: Secondary | ICD-10-CM

## 2012-12-14 DIAGNOSIS — M75102 Unspecified rotator cuff tear or rupture of left shoulder, not specified as traumatic: Secondary | ICD-10-CM

## 2012-12-14 NOTE — Patient Instructions (Signed)
rtw 2 WEEKS

## 2012-12-14 NOTE — Progress Notes (Signed)
Patient ID: William Barajas, male   DOB: 01/28/1956, 57 y.o.   MRN: [id number] Chief Complaint  Patient presents with  . Follow-up    MRI results of left shoulder.      Patient history persistent left shoulder pain had 2 injections Norco 7.5 ibuprofen physical therapy he has made some improvement still complains of pain and occasional tingling in his fingertips  Review of systems negative otherwise  Exam inspection no tenderness in the shoulder has painful range of motion after 150 passive range of motion is normal rotator cuff is intact  Scans normal  Pulses are good  No lymphadenopathy  General appearance is normal, the patient is alert and oriented x3 with normal mood and affect. BP 178/102  Ht 5\' 6"  (1.676 m)  Wt 197 lb (89.359 kg)  BMI 31.81 kg/m2  I reviewed the MRI there is no tear MRI report indicates no tear  Repeat injection start steroid Dosepak continue Norco 5 mg continue out of work for an additional 2 weeks then returned to work  [employer] see him in 4 weeks.SEHILSH   Subacromial Shoulder Injection Procedure Note  Pre-operative Diagnosis: left RC Syndrome  Post-operative Diagnosis: same  Indications: pain   Anesthesia: ethyl chloride   Procedure Details   Verbal consent was obtained for the procedure. The shoulder was prepped withalcohol and the skin was anesthetized. A 20 gauge needle was advanced into the subacromial space through posterior approach without difficulty  The space was then injected with 3 ml 1% lidocaine and 1 ml of depomedrol. The injection site was cleansed with isopropyl alcohol and a dressing was applied.  Complications:  None; patient tolerated the procedure well.

## 2012-12-19 ENCOUNTER — Ambulatory Visit (HOSPITAL_COMMUNITY)
Admission: RE | Admit: 2012-12-19 | Discharge: 2012-12-19 | Disposition: A | Discharge status: Home / Self Care | Payer: 59 | Source: Ambulatory Visit | Attending: Pulmonary Disease | Admitting: Pulmonary Disease

## 2012-12-19 DIAGNOSIS — M6281 Muscle weakness (generalized): Secondary | ICD-10-CM

## 2012-12-19 DIAGNOSIS — M75102 Unspecified rotator cuff tear or rupture of left shoulder, not specified as traumatic: Secondary | ICD-10-CM

## 2012-12-19 DIAGNOSIS — M25512 Pain in left shoulder: Secondary | ICD-10-CM

## 2012-12-19 NOTE — Progress Notes (Signed)
Occupational Therapy Treatment Patient Details  Name: William Barajas MRN: [id number] Date of Birth: 1955-11-03  Today's Date: 12/19/2012 Time: 0932-1010 OT Time Calculation (min): 38 min Manual Therapy 932-942 10' Therapeutic Exercise [id number] 27'  Visit#: 5 of 12  Re-eval: 12/29/12    Subjective Symptoms/Limitations Symptoms: S:  I feel fine, I am pretty good today, still a little bit of tingling but it is better. Pain Assessment Currently in Pain?: No/denies  Precautions/Restrictions     Exercise/Treatments Supine Protraction: PROM;AROM;10 reps Horizontal ABduction: PROM;AROM;10 reps External Rotation: PROM;AROM;10 reps Internal Rotation: PROM;AROM;10 reps Flexion: PROM;AROM;10 reps ABduction: PROM;AROM;10 reps Seated Elevation: Other (comment) (d/c to tband for scapular strengthening) Protraction: AROM;10 reps Horizontal ABduction: AROM;10 reps External Rotation: AROM;10 reps Internal Rotation: AROM;10 reps Flexion: AROM;10 reps Abduction: AROM;10 reps Standing External Rotation: Theraband;10 reps Theraband Level (Shoulder External Rotation): Level 2 (Red) Internal Rotation: Theraband;10 reps Theraband Level (Shoulder Internal Rotation): Level 2 (Red) Extension: Theraband;10 reps Theraband Level (Shoulder Extension): Level 2 (Red) Row: Theraband;10 reps Theraband Level (Shoulder Row): Level 2 (Red) Retraction: Theraband;10 reps Therapy Ball Flexion: 20 reps ABduction: 20 reps Right/Left: 5 reps ROM / Strengthening / Isometric Strengthening Wall Wash: 2   Stretches Corner Stretch: 5 reps;10 seconds       Manual Therapy Manual Therapy: Myofascial release Myofascial Release: Myofascial release and stretching to left upper arm, scapular and shoulder region to decrease pain and restrictions and improve pain free mobility and strength.  Occupational Therapy Assessment and Plan OT Assessment and Plan Clinical Impression Statement: A:  Switched to AROM  in supine, added AROM in seated and switched to scapular strengthening with tband vs. scapular AROM. Max physical and verbal cues for form and keeping shoulders depressed with exercise . OT Plan: P:  Adde UBE, continue to increase independenct with form.   Goals Short Term Goals Time to Complete Short Term Goals: 3 weeks Short Term Goal 1: Patient will be educated on a HEP. Short Term Goal 2: Patient will increase left shoulder PROM to [facility] for increased ability to don shirts with less pain.  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 Short Term Goal 4: Patient will decrease pain in his left shoulder to 4/10 when getting dressed. Short Term Goal 5: Patient will decrease fascial restrictions in his left shoulder to moderate for increased functional mobility with daily activities.  Long Term Goals Time to Complete Long Term Goals: 6 weeks Long Term Goal 1: Patient will return to prior level of function with all daily, work, and leisure activities.  Long Term Goal 2: Patient will increase left shoulder AROM to [facility] for increased ability to reach into overhead cabinets at work.  Long Term Goal 3: Patient will increase left shoulder strength to 5/5 for increased ability to lift bags of linen at work. Long Term Goal 4: Patient will decrease pain in his left shoulder to 2/10 when getting dressed. Long Term Goal 5: Patient will decrease fascial restrictions in his left shoulder to minimal for increased functional mobility with daily activities.   Problem List Patient Active Problem List  Diagnosis  . Bursitis, shoulder  . Carpal tunnel syndrome on left  . Rotator cuff syndrome of left shoulder  . Pain in joint, shoulder region  . Muscle weakness (generalized)    End of Session Activity Tolerance: Patient tolerated treatment well General Behavior During Session: [facility] for tasks performed Cognition: [facility] for  tasks performed  GO   Anabela Crayton L. Sajjad Honea, COTA/L 12/19/2012, 10:19 AM

## 2012-12-21 ENCOUNTER — Ambulatory Visit (HOSPITAL_COMMUNITY)
Admission: RE | Admit: 2012-12-21 | Discharge: 2012-12-21 | Disposition: A | Discharge status: Home / Self Care | Payer: 59 | Source: Ambulatory Visit | Attending: Pulmonary Disease | Admitting: Pulmonary Disease

## 2012-12-21 DIAGNOSIS — M6281 Muscle weakness (generalized): Secondary | ICD-10-CM

## 2012-12-21 DIAGNOSIS — M25512 Pain in left shoulder: Secondary | ICD-10-CM

## 2012-12-21 DIAGNOSIS — M75102 Unspecified rotator cuff tear or rupture of left shoulder, not specified as traumatic: Secondary | ICD-10-CM

## 2012-12-21 NOTE — Progress Notes (Signed)
Occupational Therapy Treatment Patient Details  Name: William Barajas MRN: [id number] Date of Birth: Jul 05, 1956  Today's Date: 12/21/2012 Time: 1300-1400 OT Time Calculation (min): 60 min MFR 1300-1321 21' Therex 1321-1400 39'  Visit#: 6 of 12  Re-eval: 12/29/12    Authorization:    Authorization Time Period:    Authorization Visit#:   of    Subjective Symptoms/Limitations Symptoms: S: I got a shot in my shoulder and it has been helping. Pain Assessment Currently in Pain?: No/denies  Precautions/Restrictions  Precautions Precautions: None  Exercise/Treatments Supine Protraction: PROM;5 reps;AROM;12 reps Horizontal ABduction: PROM;5 reps;AROM;12 reps External Rotation: PROM;5 reps;AROM;12 reps Internal Rotation: PROM;5 reps;AROM;12 reps Flexion: PROM;5 reps;AROM;12 reps ABduction: PROM;5 reps;AROM;12 reps Seated Protraction: AROM;12 reps Horizontal ABduction: AROM;12 reps External Rotation: AROM;12 reps Internal Rotation: AROM;12 reps Flexion: AROM;12 reps Abduction: AROM;12 reps Standing External Rotation: Theraband;10 reps Theraband Level (Shoulder External Rotation): Level 2 (Red) Internal Rotation: Theraband;10 reps Theraband Level (Shoulder Internal Rotation): Level 2 (Red) Extension: Theraband;10 reps Theraband Level (Shoulder Extension): Level 2 (Red) Row: Theraband;10 reps Theraband Level (Shoulder Row): Level 2 (Red) Retraction: Theraband;10 reps Therapy Ball Flexion: 20 reps ABduction: 20 reps Right/Left: 5 reps ROM / Strengthening / Isometric Strengthening UBE (Upper Arm Bike): 1.5 3' forward 3' reverse Wall Wash: 2'   Teacher, music: 5 reps;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 No complaints of pain. Patient is progressing well and states that he hopes he can return to work on Thursday next week. OT Plan: P: Add 1# weight to supine exercises if able to tolerate.   Goals Short Term Goals Time to Complete Short Term Goals: 3 weeks Short Term Goal 1: Patient will be educated on a HEP. Short Term Goal 2: Patient will increase left shoulder PROM to [facility] for increased ability to don shirts with less pain.  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 Short Term Goal 4: Patient will decrease pain in his left shoulder to 4/10 when getting dressed. Short Term Goal 5: Patient will decrease fascial restrictions in his left shoulder to moderate for increased functional mobility with daily activities.  Long Term Goals Time to Complete Long Term Goals: 6 weeks Long Term Goal 1: Patient will return to prior level of function with all daily, work, and leisure activities.  Long Term Goal 2: Patient will increase left shoulder AROM to [facility] for increased ability to reach into overhead cabinets at work.  Long Term Goal 3: Patient will increase left shoulder strength to 5/5 for increased ability to lift bags of linen at work. Long Term Goal 4: Patient will decrease pain in his left shoulder to 2/10 when getting dressed. Long Term Goal 5: Patient will decrease fascial restrictions in his left shoulder to minimal for increased functional mobility with daily activities.   Problem List Patient Active Problem List  Diagnosis  . Bursitis, shoulder  . Carpal tunnel syndrome on left  . Rotator cuff syndrome of left shoulder  . Pain in joint, shoulder region  . Muscle weakness (generalized)    End of Session Activity Tolerance: Patient tolerated treatment well General Cognition: WFL for  tasks performed   Limmie Patricia, OTR/L,CBIS   12/21/2012, 3:59 PM

## 2012-12-26 ENCOUNTER — Ambulatory Visit (HOSPITAL_COMMUNITY)
Admission: RE | Admit: 2012-12-26 | Discharge: 2012-12-26 | Disposition: A | Discharge status: Home / Self Care | Payer: 59 | Source: Ambulatory Visit | Attending: Pulmonary Disease | Admitting: Pulmonary Disease

## 2012-12-26 DIAGNOSIS — M75102 Unspecified rotator cuff tear or rupture of left shoulder, not specified as traumatic: Secondary | ICD-10-CM

## 2012-12-26 DIAGNOSIS — M25512 Pain in left shoulder: Secondary | ICD-10-CM

## 2012-12-26 DIAGNOSIS — M6281 Muscle weakness (generalized): Secondary | ICD-10-CM

## 2012-12-26 NOTE — Progress Notes (Signed)
Occupational Therapy Treatment Patient Details  Name: William Barajas MRN: [id number] Date of Birth: [birth year]  Today's Date: 12/26/2012 Time: 4098-1191 OT Time Calculation (min): 42 min Manual Therapy 4782-9562 20' Therapeutic Exercises 1043-1105 22' Visit#: 7 of 12  Re-eval: 12/29/12    Subjective S:  I go back to work on Thursday. Pain Assessment Currently in Pain?: No/denies  Precautions/Restrictions   n/a  Exercise/Treatments Supine Protraction: PROM;5 reps;Strengthening;10 reps Protraction Weight (lbs): 1 Horizontal ABduction: PROM;5 reps;Strengthening;10 reps Horizontal ABduction Weight (lbs): 1 External Rotation: PROM;5 reps;Strengthening;10 reps External Rotation Weight (lbs): 1 Internal Rotation: PROM;5 reps;Strengthening;10 reps Internal Rotation Weight (lbs): 1 Flexion: PROM;5 reps;Strengthening;10 reps Shoulder Flexion Weight (lbs): 1 ABduction: PROM;5 reps;Strengthening;10 reps Shoulder ABduction Weight (lbs): 1 Seated Protraction: Strengthening;10 reps Protraction Weight (lbs): 1 Horizontal ABduction: Strengthening;10 reps Horizontal ABduction Weight (lbs): 1 External Rotation: Strengthening;10 reps External Rotation Weight (lbs): 1 Internal Rotation: Strengthening;10 reps Internal Rotation Weight (lbs): 1 Flexion: Strengthening;10 reps Flexion Weight (lbs): 1 Abduction: Strengthening;10 reps ABduction Weight (lbs): 1 Standing External Rotation:  (resume next ivist) Therapy Ball Flexion:  (dc) ROM / Strengthening / Isometric Strengthening UBE (Upper Arm Bike): 1.5 3' forward 3' reverse Wall Wash: 2 minutes with 1 pound X to V Arms: 10 times with 1 pound.  Patient requires max vg and demonstration for proper sequencing Proximal Shoulder Strengthening, Seated: 10 times each with 1 poudn with no rests, with max vg and visual cuing.      Manual Therapy Manual Therapy: Myofascial release Myofascial Release: Myofascial release and stretching to  left upper arm, scapular and shoulder region to decrease pain and restrictions and improve pain free mobility and strength  Occupational Therapy Assessment and Plan OT Assessment and Plan Clinical Impression Statement: A:  Added 1 pound to supine and seated exercises.  Added proximal shoulder strengthening exercises, which patient required max vg and visual cues to complete.  OT Plan: P:  Reassess, less cuing with exercises as patient becomes more independent.    Goals Short Term Goals Time to Complete Short Term Goals: 3 weeks Short Term Goal 1: Patient will be educated on a HEP. Short Term Goal 1 Progress: Progressing toward goal Short Term Goal 2: Patient will increase left shoulder PROM to [facility] for increased ability to don shirts with less pain.  Short Term Goal 2 Progress: Progressing toward goal Short Term Goal 3: Patient will increase left shoulder strength to 4+/5 for increased ability to push the vacuum cleaner at work. Short Term Goal 3 Progress: Progressing toward goal Short Term Goal 4: Patient will decrease pain in his left shoulder to 4/10 when getting dressed. Short Term Goal 4 Progress: Progressing toward goal Short Term Goal 5: Patient will decrease fascial restrictions in his left shoulder to moderate for increased functional mobility with daily activities.  Short Term Goal 5 Progress: Progressing toward goal Long Term Goals Time to Complete Long Term Goals: 6 weeks Long Term Goal 1: Patient will return to prior level of function with all daily, work, and leisure activities.  Long Term Goal 1 Progress: Progressing toward goal Long Term Goal 2: Patient will increase left shoulder AROM to [facility] for increased ability to reach into overhead cabinets at work.  Long Term Goal 2 Progress: Progressing toward goal Long Term Goal 3: Patient will increase left shoulder strength to 5/5 for increased ability to lift bags of linen at work. Long Term Goal 3 Progress: Progressing toward  goal Long Term Goal 4: Patient will decrease pain in his  left shoulder to 2/10 when getting dressed. Long Term Goal 4 Progress: Progressing toward goal Long Term Goal 5: Patient will decrease fascial restrictions in his left shoulder to minimal for increased functional mobility with daily activities.  Long Term Goal 5 Progress: Progressing toward goal  Problem List Patient Active Problem List  Diagnosis  . Bursitis, shoulder  . Carpal tunnel syndrome on left  . Rotator cuff syndrome of left shoulder  . Pain in joint, shoulder region  . Muscle weakness (generalized)    End of Session Activity Tolerance: Patient tolerated treatment well General Behavior During Therapy: WFL for tasks assessed/performed Cognition: WFL for tasks performed  GO    Shirlean Mylar, OTR/L  12/26/2012, 11:03 AM

## 2012-12-28 ENCOUNTER — Ambulatory Visit (HOSPITAL_COMMUNITY)
Admission: RE | Admit: 2012-12-28 | Discharge: 2012-12-28 | Disposition: A | Discharge status: Home / Self Care | Payer: 59 | Source: Ambulatory Visit | Attending: Pulmonary Disease | Admitting: Pulmonary Disease

## 2012-12-28 DIAGNOSIS — M6281 Muscle weakness (generalized): Secondary | ICD-10-CM

## 2012-12-28 DIAGNOSIS — M75102 Unspecified rotator cuff tear or rupture of left shoulder, not specified as traumatic: Secondary | ICD-10-CM

## 2012-12-28 DIAGNOSIS — M25512 Pain in left shoulder: Secondary | ICD-10-CM

## 2012-12-28 NOTE — Progress Notes (Signed)
Occupational Therapy Treatment Patient Details  Name: William Barajas MRN: [id number] Date of Birth: 1955/12/20  Today's Date: 12/28/2012 Time: 7829-5621 OT Time Calculation (min): 26 min Manual Therapy 3086-5784 16' Reassessment [address] 10'  Visit#: 8 of 12  Re-eval: 12/29/12    Subjective S:  I feel good.  No pain Pain Assessment Currently in Pain?: No/denies Pain Score: 0-No pain  Precautions/Restrictions   n/a  Exercise/Treatments    Manual Therapy Manual Therapy: Myofascial release Myofascial Release: Myofascial release and stretching to left upper arm, scapular and shoulder region to decrease pain and restrictions and improve pain free mobility and strength,  Occupational Therapy Assessment and Plan OT Assessment and Plan Clinical Impression Statement: A:  Reassessment completed this date:  current (initial evaluation)  seated shoulder AROM:  flexion 161 5/5 (95 4/5), abduction 180 5/5 (130 4/5), external rotation with shoulder abducted 90 5/5 (30 4/5), internal rotation with shoulder abducted 80 5/5 (50 4/5).  DASH scored improved from 40 to 0, which is the ideal score.  Mr. Fogg is functioning at prior level of I and has met all short term and long term goals.   OT Plan: P:  DC from skilled OT intervention this date, patient will continue with HEP in order to avoid new onset of symptoms.    Goals Short Term Goals Time to Complete Short Term Goals: 3 weeks Short Term Goal 1: Patient will be educated on a HEP. Short Term Goal 1 Progress: Met Short Term Goal 2: Patient will increase left shoulder PROM to [facility] for increased ability to don shirts with less pain.  Short Term Goal 2 Progress: Met Short Term Goal 3: Patient will increase left shoulder strength to 4+/5 for increased ability to push the vacuum cleaner at work. Short Term Goal 3 Progress: Met Short Term Goal 4: Patient will decrease pain in his left shoulder to 4/10 when getting dressed. Short Term Goal  4 Progress: Met Short Term Goal 5: Patient will decrease fascial restrictions in his left shoulder to moderate for increased functional mobility with daily activities.  Short Term Goal 5 Progress: Met Long Term Goals Time to Complete Long Term Goals: 6 weeks Long Term Goal 1: Patient will return to prior level of function with all daily, work, and leisure activities.  Long Term Goal 1 Progress: Met Long Term Goal 2: Patient will increase left shoulder AROM to [facility] for increased ability to reach into overhead cabinets at work.  Long Term Goal 2 Progress: Met Long Term Goal 3: Patient will increase left shoulder strength to 5/5 for increased ability to lift bags of linen at work. Long Term Goal 3 Progress: Met Long Term Goal 4: Patient will decrease pain in his left shoulder to 2/10 when getting dressed. Long Term Goal 4 Progress: Met Long Term Goal 5: Patient will decrease fascial restrictions in his left shoulder to minimal for increased functional mobility with daily activities.  Long Term Goal 5 Progress: Met  Problem List Patient Active Problem List  Diagnosis  . Bursitis, shoulder  . Carpal tunnel syndrome on left  . Rotator cuff syndrome of left shoulder  . Pain in joint, shoulder region  . Muscle weakness (generalized)    End of Session Activity Tolerance: Patient tolerated treatment well General Behavior During Therapy: WFL for tasks assessed/performed Cognition: WFL for tasks performed   Shirlean Mylar, OTR/L  12/28/2012, 10:48 AM

## 2013-01-11 ENCOUNTER — Encounter: Payer: Self-pay | Admitting: Orthopedic Surgery

## 2013-01-11 ENCOUNTER — Ambulatory Visit (INDEPENDENT_AMBULATORY_CARE_PROVIDER_SITE_OTHER): Payer: 59 | Admitting: Orthopedic Surgery

## 2013-01-11 VITALS — BP 160/82 | Ht 66.0 in | Wt 197.0 lb

## 2013-01-11 DIAGNOSIS — M75102 Unspecified rotator cuff tear or rupture of left shoulder, not specified as traumatic: Secondary | ICD-10-CM

## 2013-01-11 DIAGNOSIS — M7552 Bursitis of left shoulder: Secondary | ICD-10-CM

## 2013-01-11 DIAGNOSIS — M67919 Unspecified disorder of synovium and tendon, unspecified shoulder: Secondary | ICD-10-CM

## 2013-01-11 NOTE — Patient Instructions (Signed)
Continue your exercise program and take medication as needed

## 2013-01-11 NOTE — Progress Notes (Signed)
Patient ID: William Barajas, male   DOB: Mar 07, 1956, 57 y.o.   MRN: [id number] Chief complaint recheck right shoulder  Patient doing well no soreness. No pain.  He returned to work doing well at work   review of systems no tenderness pain numbness or tingling  BP 160/82  Ht 5\' 6"  (1.676 m)  Wt 197 lb (89.359 kg)  BMI 31.81 kg/m2  Exam shows well-developed well-nourished male with full range of motion and grade 5 strength right shoulder. No instability normal neurovascular exam skin intact without rash normal sensation  Return as needed continue exercises at home

## 2013-03-12 ENCOUNTER — Telehealth: Payer: Self-pay | Admitting: *Deleted

## 2013-03-12 NOTE — Telephone Encounter (Signed)
Patient no showed for bilateral NCS on 01/01/13

## 2017-01-31 ENCOUNTER — Emergency Department (HOSPITAL_COMMUNITY): Payer: 59

## 2017-01-31 ENCOUNTER — Encounter (HOSPITAL_COMMUNITY): Payer: Self-pay

## 2017-01-31 ENCOUNTER — Emergency Department (HOSPITAL_COMMUNITY)
Admission: EM | Admit: 2017-01-31 | Discharge: 2017-02-01 | Disposition: A | Discharge status: Home / Self Care | Payer: 59 | Attending: Emergency Medicine | Admitting: Emergency Medicine

## 2017-01-31 DIAGNOSIS — X58XXXA Exposure to other specified factors, initial encounter: Secondary | ICD-10-CM | POA: Insufficient documentation

## 2017-01-31 DIAGNOSIS — I10 Essential (primary) hypertension: Secondary | ICD-10-CM | POA: Insufficient documentation

## 2017-01-31 DIAGNOSIS — Y929 Unspecified place or not applicable: Secondary | ICD-10-CM | POA: Insufficient documentation

## 2017-01-31 DIAGNOSIS — T148XXA Other injury of unspecified body region, initial encounter: Secondary | ICD-10-CM

## 2017-01-31 DIAGNOSIS — Y939 Activity, unspecified: Secondary | ICD-10-CM | POA: Diagnosis not present

## 2017-01-31 DIAGNOSIS — L089 Local infection of the skin and subcutaneous tissue, unspecified: Secondary | ICD-10-CM

## 2017-01-31 DIAGNOSIS — M79674 Pain in right toe(s): Secondary | ICD-10-CM | POA: Diagnosis not present

## 2017-01-31 DIAGNOSIS — M7989 Other specified soft tissue disorders: Secondary | ICD-10-CM | POA: Diagnosis not present

## 2017-01-31 DIAGNOSIS — S90821A Blister (nonthermal), right foot, initial encounter: Secondary | ICD-10-CM | POA: Diagnosis not present

## 2017-01-31 DIAGNOSIS — Y999 Unspecified external cause status: Secondary | ICD-10-CM | POA: Diagnosis not present

## 2017-01-31 DIAGNOSIS — S90421A Blister (nonthermal), right great toe, initial encounter: Secondary | ICD-10-CM | POA: Diagnosis not present

## 2017-01-31 DIAGNOSIS — S99921A Unspecified injury of right foot, initial encounter: Secondary | ICD-10-CM | POA: Diagnosis present

## 2017-01-31 HISTORY — DX: Essential (primary) hypertension: I10

## 2017-01-31 LAB — CBC WITH DIFFERENTIAL/PLATELET
BASOS PCT: 0 %
Basophils Absolute: 0 10*3/uL (ref 0.0–0.1)
Eosinophils Absolute: 0.1 10*3/uL (ref 0.0–0.7)
Eosinophils Relative: 0 %
HEMATOCRIT: 41 % (ref 39.0–52.0)
HEMOGLOBIN: 14 g/dL (ref 13.0–17.0)
LYMPHS ABS: 1.5 10*3/uL (ref 0.7–4.0)
LYMPHS PCT: 11 %
MCH: 29.8 pg (ref 26.0–34.0)
MCHC: 34.1 g/dL (ref 30.0–36.0)
MCV: 87.2 fL (ref 78.0–100.0)
MONO ABS: 1.5 10*3/uL — AB (ref 0.1–1.0)
MONOS PCT: 11 %
NEUTROS ABS: 10.8 10*3/uL — AB (ref 1.7–7.7)
NEUTROS PCT: 78 %
Platelets: 207 10*3/uL (ref 150–400)
RBC: 4.7 MIL/uL (ref 4.22–5.81)
RDW: 13.8 % (ref 11.5–15.5)
WBC: 13.8 10*3/uL — ABNORMAL HIGH (ref 4.0–10.5)

## 2017-01-31 LAB — BASIC METABOLIC PANEL
Anion gap: 9 (ref 5–15)
BUN: 16 mg/dL (ref 6–20)
CHLORIDE: 105 mmol/L (ref 101–111)
CO2: 30 mmol/L (ref 22–32)
CREATININE: 1.12 mg/dL (ref 0.61–1.24)
Calcium: 9.6 mg/dL (ref 8.9–10.3)
GFR calc Af Amer: >60 mL/min (ref >60)
GFR calc non Af Amer: >60 mL/min (ref >60)
Glucose, Bld: 104 mg/dL — ABNORMAL HIGH (ref 65–99)
POTASSIUM: 3.4 mmol/L — AB (ref 3.5–5.1)
SODIUM: 144 mmol/L (ref 135–145)

## 2017-01-31 MED ORDER — CEPHALEXIN 500 MG PO CAPS
500.0000 mg | ORAL_CAPSULE | Freq: Once | ORAL | Status: AC
  Administered 2017-01-31: 500 mg via ORAL
  Filled 2017-01-31: qty 1

## 2017-01-31 MED ORDER — CEPHALEXIN 500 MG PO CAPS: 500.0000 mg | ORAL_CAPSULE | Freq: Four times a day (QID) | ORAL | 0 refills | Status: DC

## 2017-01-31 MED ORDER — HYDROCHLOROTHIAZIDE 25 MG PO TABS: 25.0000 mg | ORAL_TABLET | Freq: Every day | ORAL | 0 refills | Status: DC

## 2017-01-31 MED ORDER — HYDROGEN PEROXIDE 3 % EX SOLN
CUTANEOUS | Status: AC
  Administered 2017-01-31
  Filled 2017-01-31: qty 473

## 2017-01-31 MED ORDER — ACETAMINOPHEN 325 MG PO TABS
650.0000 mg | ORAL_TABLET | Freq: Once | ORAL | Status: AC
  Administered 2017-01-31: 650 mg via ORAL
  Filled 2017-01-31: qty 2

## 2017-01-31 NOTE — ED Notes (Signed)
Pt a&o x 4, BP elevated-pt states he has not taken his bp meds, discussed importance, benefits and risks of high bp and taking meds, pt and spouse verbalized understanding, all other vss, RX/verbal/written given, pt verbalized understanding, pt ambulated off unit with steady gait in good condition

## 2017-01-31 NOTE — ED Triage Notes (Signed)
Reports of right great toe pain that started last night. Has been soaking in episome salt with no relief. Patient BP 178/98 (reports of not taking medication for "long time").

## 2017-01-31 NOTE — ED Notes (Signed)
Dressing applied to left great toe.

## 2017-01-31 NOTE — ED Provider Notes (Signed)
[facility] DEPT Provider Note   CSN: [id number] Arrival date & time: 01/31/17  2005 By signing my name below, I, William Barajas, attest that this documentation has been prepared under the direction and in the presence of Dorie Rank, MD . Electronically Signed: [name], Scribe. 01/31/2017. 11:03 PM.   History   Chief Complaint Chief Complaint  Patient presents with  . Toe Pain   HPI William Barajas is a 61 y.o. male with a history of HTN who presents to the Emergency Department complaining of constant, moderate right great toe pain onset last night. He reports associated mild drainage from the area. Pt has soaked his toe with epsom salt with no relief. Pt is unsure of any prior blisters to the area.  Pt also reports that his blood pressure is elevated and states he is not currently on HTN medication.   PCP: [name], MD   The history is provided by the patient. No language interpreter was used.   Past Medical History:  Diagnosis Date  . Hypertension     Patient Active Problem List   Diagnosis Date Noted  . Rotator cuff syndrome of left shoulder 12/01/2012  . Pain in joint, shoulder region 12/01/2012  . Muscle weakness (generalized) 12/01/2012  . Carpal tunnel syndrome on left 11/08/2012  . Bursitis, shoulder 10/30/2012    Past Surgical History:  Procedure Laterality Date  . KNEE SURGERY Right        Home Medications    Prior to Admission medications   Medication Sig Start Date End Date Taking? Authorizing Provider  cephALEXin (KEFLEX) 500 MG capsule Take 1 capsule (500 mg total) by mouth 4 (four) times daily. 01/31/17   Dorie Rank, MD  hydrochlorothiazide (HYDRODIURIL) 25 MG tablet Take 1 tablet (25 mg total) by mouth daily. 01/31/17   Dorie Rank, MD    Family History Family History  Problem Relation Age of Onset  . Arthritis Mother   . Cancer Unknown   . Asthma Unknown   . Diabetes Unknown     Social History Social History  Substance Use  Topics  . Smoking status: Never Smoker  . Smokeless tobacco: Never Used  . Alcohol use No     Allergies   Patient has no known allergies.   Review of Systems Review of Systems  Musculoskeletal: Positive for myalgias.  All other systems reviewed and are negative.  Physical Exam Updated Vital Signs BP (!) 178/98 (BP Location: Right Arm)   Pulse 99   Temp 99.8 F (37.7 C) (Oral)   Resp 17   Ht 1.702 m (5\' 7" )   Wt 81.6 kg (180 lb)   SpO2 100%   BMI 28.19 kg/m   Physical Exam  Constitutional: He appears well-developed and well-nourished. No distress.  HENT:  Head: Normocephalic and atraumatic.  Right Ear: External ear normal.  Left Ear: External ear normal.  Eyes: Conjunctivae are normal. Right eye exhibits no discharge. Left eye exhibits no discharge. No scleral icterus.  Neck: Neck supple. No tracheal deviation present.  Cardiovascular: Normal rate.   Pulmonary/Chest: Effort normal. No stridor. No respiratory distress.  Abdominal: He exhibits no distension.  Musculoskeletal: He exhibits no edema.  Blister with surrounding erythema right big toe, purulent material below the epidermis  Neurological: He is alert. Cranial nerve deficit: no gross deficits.  Skin: Skin is warm and dry. No rash noted.  Psychiatric: He has a normal mood and affect.  Nursing note and vitals reviewed.  ED Treatments / Results  DIAGNOSTIC STUDIES:  Oxygen Saturation is 100% on RA, normal by my interpretation.    COORDINATION OF CARE:  11:05 PM Discussed treatment plan with pt at bedside and pt agreed to plan.   Labs (all labs ordered are listed, but only abnormal results are displayed) Labs Reviewed  CBC WITH DIFFERENTIAL/PLATELET - Abnormal; Notable for the following:       Result Value   WBC 13.8 (*)    Neutro Abs 10.8 (*)    Monocytes Absolute 1.5 (*)    All other components within normal limits  BASIC METABOLIC PANEL - Abnormal; Notable for the following:    Potassium 3.4 (*)     Glucose, Bld 104 (*)    All other components within normal limits    Radiology Dg Foot Complete Right  Result Date: 01/31/2017 CLINICAL DATA:  Pain in RIGHT big toe.  This began yesterday. EXAM: RIGHT FOOT COMPLETE - 3+ VIEW COMPARISON:  None. FINDINGS: There is no evidence of fracture or dislocation. There is no evidence of arthropathy or other focal bone abnormality. Mild soft tissue swelling surrounding the distal phalanx. No visible radiopaque foreign body. IMPRESSION: Soft tissue swelling.  No osseous findings or visible foreign body. Electronically Signed   By: Staci Righter M.D.   On: 01/31/2017 21:07    Procedures .Marland KitchenIncision and Drainage Date/Time: 01/31/2017 11:18 PM Performed by: Dorie Rank Authorized by: Dorie Rank   Consent:    Consent obtained:  Verbal   Consent given by:  Patient   Risks discussed:  Bleeding, incomplete drainage, pain and damage to other organs   Alternatives discussed:  No treatment Location:    Indications for incision and drainage: infected blister.   Location: right big toe. Pre-procedure details:    Skin preparation:  Chloraprep Procedure type:    Complexity:  Complex Procedure details:    Incision types:  Single straight   Incision depth:  Dermal   Scalpel blade:  11   Drainage:  Purulent   Drainage amount:  Scant   Packing materials:  None Post-procedure details:    Patient tolerance of procedure:  Tolerated well, no immediate complications   (including critical care time)  Medications Ordered in ED Medications  hydrogen peroxide 3 % external solution (not administered)  cephALEXin (KEFLEX) capsule 500 mg (not administered)  acetaminophen (TYLENOL) tablet 650 mg (not administered)     Initial Impression / Assessment and Plan / ED Course  I have reviewed the triage vital signs and the nursing notes.  Pertinent labs & imaging results that were available during my care of the patient were reviewed by me and considered in my  medical decision making (see chart for details).   patient appears to have an infected blister of his toe. Performed a superficial incision and a small amount of purulent material was released.  Patient also has hypertension. He has been told this in the past but is not taking any medications. I'll give him a prescription for hydrochlorothiazide and have him follow-up with his primary doctor.  Final Clinical Impressions(s) / ED Diagnoses   Final diagnoses:  Infected blister  Essential hypertension    New Prescriptions New Prescriptions   CEPHALEXIN (KEFLEX) 500 MG CAPSULE    Take 1 capsule (500 mg total) by mouth 4 (four) times daily.   HYDROCHLOROTHIAZIDE (HYDRODIURIL) 25 MG TABLET    Take 1 tablet (25 mg total) by mouth daily.    I personally performed the services described in this documentation, which was scribed in  my presence.  The recorded information has been reviewed and is accurate.    Dorie Rank, MD 01/31/17 [phone]

## 2017-01-31 NOTE — Discharge Instructions (Signed)
Subacute to several times per day, take the antibiotics as prescribed, take Tylenol as needed for pain.  Follow-up with your primary care doctor to check on your blood pressure.

## 2017-02-08 DIAGNOSIS — L03031 Cellulitis of right toe: Secondary | ICD-10-CM | POA: Diagnosis not present

## 2017-02-08 DIAGNOSIS — I10 Essential (primary) hypertension: Secondary | ICD-10-CM | POA: Diagnosis not present

## 2017-02-23 DIAGNOSIS — F79 Unspecified intellectual disabilities: Secondary | ICD-10-CM | POA: Diagnosis not present

## 2017-02-23 DIAGNOSIS — I10 Essential (primary) hypertension: Secondary | ICD-10-CM | POA: Diagnosis not present

## 2017-02-23 DIAGNOSIS — L84 Corns and callosities: Secondary | ICD-10-CM | POA: Diagnosis not present

## 2017-02-23 DIAGNOSIS — L03031 Cellulitis of right toe: Secondary | ICD-10-CM | POA: Diagnosis not present

## 2017-03-11 DIAGNOSIS — M79672 Pain in left foot: Secondary | ICD-10-CM | POA: Diagnosis not present

## 2017-03-11 DIAGNOSIS — M79671 Pain in right foot: Secondary | ICD-10-CM | POA: Diagnosis not present

## 2017-03-11 DIAGNOSIS — B079 Viral wart, unspecified: Secondary | ICD-10-CM | POA: Diagnosis not present

## 2017-03-11 DIAGNOSIS — B351 Tinea unguium: Secondary | ICD-10-CM | POA: Diagnosis not present

## 2017-03-11 DIAGNOSIS — M2041 Other hammer toe(s) (acquired), right foot: Secondary | ICD-10-CM | POA: Diagnosis not present

## 2017-03-31 DIAGNOSIS — M79671 Pain in right foot: Secondary | ICD-10-CM | POA: Diagnosis not present

## 2017-03-31 DIAGNOSIS — B079 Viral wart, unspecified: Secondary | ICD-10-CM | POA: Diagnosis not present

## 2017-03-31 DIAGNOSIS — M79672 Pain in left foot: Secondary | ICD-10-CM | POA: Diagnosis not present

## 2017-04-14 DIAGNOSIS — M79671 Pain in right foot: Secondary | ICD-10-CM | POA: Diagnosis not present

## 2017-04-14 DIAGNOSIS — M79672 Pain in left foot: Secondary | ICD-10-CM | POA: Diagnosis not present

## 2017-04-14 DIAGNOSIS — B351 Tinea unguium: Secondary | ICD-10-CM | POA: Diagnosis not present

## 2017-04-14 DIAGNOSIS — L03031 Cellulitis of right toe: Secondary | ICD-10-CM | POA: Diagnosis not present

## 2017-04-14 DIAGNOSIS — L03032 Cellulitis of left toe: Secondary | ICD-10-CM | POA: Diagnosis not present

## 2017-04-28 DIAGNOSIS — L600 Ingrowing nail: Secondary | ICD-10-CM | POA: Diagnosis not present

## 2017-05-12 DIAGNOSIS — B351 Tinea unguium: Secondary | ICD-10-CM | POA: Diagnosis not present

## 2017-05-12 DIAGNOSIS — M79672 Pain in left foot: Secondary | ICD-10-CM | POA: Diagnosis not present

## 2017-05-12 DIAGNOSIS — M79671 Pain in right foot: Secondary | ICD-10-CM | POA: Diagnosis not present

## 2017-05-26 DIAGNOSIS — T8189XA Other complications of procedures, not elsewhere classified, initial encounter: Secondary | ICD-10-CM | POA: Diagnosis not present

## 2017-08-25 DIAGNOSIS — I10 Essential (primary) hypertension: Secondary | ICD-10-CM | POA: Diagnosis not present

## 2017-08-25 DIAGNOSIS — M1990 Unspecified osteoarthritis, unspecified site: Secondary | ICD-10-CM | POA: Diagnosis not present

## 2017-08-25 DIAGNOSIS — F79 Unspecified intellectual disabilities: Secondary | ICD-10-CM | POA: Diagnosis not present

## 2017-08-25 DIAGNOSIS — N529 Male erectile dysfunction, unspecified: Secondary | ICD-10-CM | POA: Diagnosis not present

## 2017-09-01 DIAGNOSIS — N529 Male erectile dysfunction, unspecified: Secondary | ICD-10-CM | POA: Diagnosis not present

## 2017-09-01 DIAGNOSIS — F79 Unspecified intellectual disabilities: Secondary | ICD-10-CM | POA: Diagnosis not present

## 2017-09-01 DIAGNOSIS — I10 Essential (primary) hypertension: Secondary | ICD-10-CM | POA: Diagnosis not present

## 2017-09-01 DIAGNOSIS — M1990 Unspecified osteoarthritis, unspecified site: Secondary | ICD-10-CM | POA: Diagnosis not present

## 2018-02-23 DIAGNOSIS — Z0000 Encounter for general adult medical examination without abnormal findings: Secondary | ICD-10-CM | POA: Diagnosis not present

## 2018-02-23 DIAGNOSIS — Z1211 Encounter for screening for malignant neoplasm of colon: Secondary | ICD-10-CM | POA: Diagnosis not present

## 2018-04-04 ENCOUNTER — Telehealth: Payer: Self-pay

## 2018-04-04 ENCOUNTER — Ambulatory Visit: Payer: 59

## 2018-04-04 NOTE — Telephone Encounter (Signed)
Patient was a no show and letter sent  °

## 2018-04-04 NOTE — Telephone Encounter (Signed)
noted 

## 2018-08-25 DIAGNOSIS — M1990 Unspecified osteoarthritis, unspecified site: Secondary | ICD-10-CM | POA: Diagnosis not present

## 2018-08-25 DIAGNOSIS — N529 Male erectile dysfunction, unspecified: Secondary | ICD-10-CM | POA: Diagnosis not present

## 2018-08-25 DIAGNOSIS — F79 Unspecified intellectual disabilities: Secondary | ICD-10-CM | POA: Diagnosis not present

## 2018-08-25 DIAGNOSIS — I10 Essential (primary) hypertension: Secondary | ICD-10-CM | POA: Diagnosis not present

## 2018-09-01 DIAGNOSIS — Z125 Encounter for screening for malignant neoplasm of prostate: Secondary | ICD-10-CM | POA: Diagnosis not present

## 2018-09-01 DIAGNOSIS — N529 Male erectile dysfunction, unspecified: Secondary | ICD-10-CM | POA: Diagnosis not present

## 2018-09-01 DIAGNOSIS — F79 Unspecified intellectual disabilities: Secondary | ICD-10-CM | POA: Diagnosis not present

## 2018-09-01 DIAGNOSIS — I10 Essential (primary) hypertension: Secondary | ICD-10-CM | POA: Diagnosis not present

## 2018-09-01 DIAGNOSIS — M1990 Unspecified osteoarthritis, unspecified site: Secondary | ICD-10-CM | POA: Diagnosis not present

## 2019-02-22 DIAGNOSIS — Z0000 Encounter for general adult medical examination without abnormal findings: Secondary | ICD-10-CM | POA: Diagnosis not present

## 2019-04-10 DIAGNOSIS — Z20828 Contact with and (suspected) exposure to other viral communicable diseases: Secondary | ICD-10-CM | POA: Diagnosis not present

## 2020-01-15 ENCOUNTER — Emergency Department (HOSPITAL_COMMUNITY): Payer: 59

## 2020-01-15 ENCOUNTER — Emergency Department (HOSPITAL_COMMUNITY)
Admission: EM | Admit: 2020-01-15 | Discharge: 2020-01-15 | Disposition: A | Discharge status: Home / Self Care | Payer: 59 | Attending: Emergency Medicine | Admitting: Emergency Medicine

## 2020-01-15 ENCOUNTER — Other Ambulatory Visit: Payer: Self-pay

## 2020-01-15 ENCOUNTER — Encounter (HOSPITAL_COMMUNITY): Payer: Self-pay | Admitting: Emergency Medicine

## 2020-01-15 DIAGNOSIS — M25551 Pain in right hip: Secondary | ICD-10-CM | POA: Insufficient documentation

## 2020-01-15 DIAGNOSIS — I10 Essential (primary) hypertension: Secondary | ICD-10-CM | POA: Diagnosis not present

## 2020-01-15 MED ORDER — ACETAMINOPHEN 500 MG PO TABS: 500.0000 mg | ORAL_TABLET | Freq: Four times a day (QID) | ORAL | 0 refills | Status: DC | PRN

## 2020-01-15 MED ORDER — HYDROCODONE-ACETAMINOPHEN 5-325 MG PO TABS
1.0000 | ORAL_TABLET | Freq: Once | ORAL | Status: AC
  Administered 2020-01-15: 1 via ORAL
  Filled 2020-01-15: qty 1

## 2020-01-15 NOTE — ED Provider Notes (Signed)
[facility] EMERGENCY DEPARTMENT Provider Note   CSN: [id number] Arrival date & time: 01/15/20  [id number]     History Chief Complaint  Patient presents with  . Hip Pain    right    William Barajas is a 64 y.o. male.  HPI     This is a 64 year old male with a history of hypertension who presents with right hip pain.  Patient reports worsening right hip pain over the last 24 hours.  He describes it as achiness and "stiffness."  It is worse when he goes from sitting to standing.  He has been ambulatory.  He states that the pain radiates from his right hip down the lateral aspect of his leg to his knee.  He denies any back pain.  Denies weakness, numbness, tingling, bowel or bladder difficulties.  Currently he rates his pain a 10 out of 10.  He did not take anything for his pain prior to arrival.  Denies fevers.  Past Medical History:  Diagnosis Date  . Hypertension     Patient Active Problem List   Diagnosis Date Noted  . Rotator cuff syndrome of left shoulder 12/01/2012  . Pain in joint, shoulder region 12/01/2012  . Muscle weakness (generalized) 12/01/2012  . Carpal tunnel syndrome on left 11/08/2012  . Bursitis, shoulder 10/30/2012    Past Surgical History:  Procedure Laterality Date  . KNEE SURGERY Right        Family History  Problem Relation Age of Onset  . Arthritis Mother   . Cancer Other   . Asthma Other   . Diabetes Other     Social History   Tobacco Use  . Smoking status: Never Smoker  . Smokeless tobacco: Never Used  Substance Use Topics  . Alcohol use: No  . Drug use: No    Home Medications Prior to Admission medications   Medication Sig Start Date End Date Taking? Authorizing Provider  acetaminophen (TYLENOL) 500 MG tablet Take 1 tablet (500 mg total) by mouth every 6 (six) hours as needed. 01/15/20   Lorma Heater, Barbette Hair, MD  cephALEXin (KEFLEX) 500 MG capsule Take 1 capsule (500 mg total) by mouth 4 (four) times daily. 01/31/17   Dorie Rank, MD    hydrochlorothiazide (HYDRODIURIL) 25 MG tablet Take 1 tablet (25 mg total) by mouth daily. 01/31/17   Dorie Rank, MD    Allergies    Patient has no known allergies.  Review of Systems   Review of Systems  Constitutional: Negative for fever.  Genitourinary: Negative for difficulty urinating.  Musculoskeletal:       Right hip pain  Neurological: Negative for weakness and numbness.  All other systems reviewed and are negative.   Physical Exam Updated Vital Signs BP (!) 154/90   Pulse 72   Temp 98.2 F (36.8 C) (Oral)   Resp 18   Ht 1.676 m (5\' 6" )   Wt 76.2 kg   SpO2 97%   BMI 27.12 kg/m   Physical Exam Vitals and nursing note reviewed.  Constitutional:      Appearance: He is well-developed. He is not ill-appearing.  HENT:     Head: Normocephalic and atraumatic.     Nose: Nose normal.     Mouth/Throat:     Mouth: Mucous membranes are moist.  Eyes:     Pupils: Pupils are equal, round, and reactive to light.  Cardiovascular:     Rate and Rhythm: Normal rate and regular rhythm.  Pulmonary:  Effort: Pulmonary effort is normal. No respiratory distress.  Abdominal:     Palpations: Abdomen is soft.     Tenderness: There is no abdominal tenderness.  Musculoskeletal:     Cervical back: Neck supple.     Comments: Tenderness to palpation over the lateral aspect of the right hip, normal range of motion, no obvious deformities, negative straight leg raise, no overlying skin changes  Lymphadenopathy:     Cervical: No cervical adenopathy.  Skin:    General: Skin is warm and dry.  Neurological:     Mental Status: He is alert and oriented to person, place, and time.     Comments: 5 out of 5 strength with hip flexion and extension, knee flexion and extension 2+ bilateral patellar reflexes  Psychiatric:        Mood and Affect: Mood normal.     ED Results / Procedures / Treatments   Labs (all labs ordered are listed, but only abnormal results are displayed) Labs Reviewed  - No data to display  EKG None  Radiology DG Hip Unilat W or Wo Pelvis 2-3 Views Right  Result Date: 01/15/2020 CLINICAL DATA:  Right hip pain since yesterday EXAM: DG HIP (WITH OR WITHOUT PELVIS) 2-3V RIGHT COMPARISON:  02/21/2011 FINDINGS: There is no evidence of hip fracture or dislocation. There is no evidence of arthropathy or other focal bone abnormality. IMPRESSION: Negative. Electronically Signed   By: Monte Fantasia M.D.   On: 01/15/2020 04:57    Procedures Procedures (including critical care time)  Medications Ordered in ED Medications  HYDROcodone-acetaminophen (NORCO/VICODIN) 5-325 MG per tablet 1 tablet (1 tablet Oral Given 01/15/20 0435)    ED Course  I have reviewed the triage vital signs and the nursing notes.  Pertinent labs & imaging results that were available during my care of the patient were reviewed by me and considered in my medical decision making (see chart for details).    MDM Rules/Calculators/A&P                       Patient presents with right hip pain.  He is overall nontoxic and vital signs are notable for elevated blood pressure.  This is in the setting of pain.  Patient given a Norco.  X-rays obtained of the hip and showed no evidence of occult fracture.  Given location of pain, suspect possible bursitis.  However, osteoarthritis is also consideration given the description.  Doubt sciatica.  Recommend supportive measures with ice and ongoing Tylenol.  Anti-inflammatories as needed.  After history, exam, and medical workup I feel the patient has been appropriately medically screened and is safe for discharge home. Pertinent diagnoses were discussed with the patient. Patient was given return precautions.   Final Clinical Impression(s) / ED Diagnoses Final diagnoses:  Right hip pain    Rx / DC Orders ED Discharge Orders         Ordered    acetaminophen (TYLENOL) 500 MG tablet  Every 6 hours PRN     01/15/20 0521           Merryl Hacker, MD 01/15/20 [phone]

## 2020-01-15 NOTE — ED Triage Notes (Signed)
Patient states right hip pain that radiates from his right hip down his right leg. Patient denies injury and states that pain started yesterday.

## 2020-01-15 NOTE — Discharge Instructions (Signed)
You were seen today for hip pain.  Your work-up is reassuring and x-rays are negative.  You may have some arthritis of the hip.  Take Tylenol Motrin as needed for pain.

## 2021-01-16 ENCOUNTER — Other Ambulatory Visit: Payer: Self-pay

## 2021-01-16 ENCOUNTER — Encounter: Payer: Self-pay | Admitting: Nurse Practitioner

## 2021-01-16 ENCOUNTER — Ambulatory Visit (INDEPENDENT_AMBULATORY_CARE_PROVIDER_SITE_OTHER): Payer: 59 | Admitting: Nurse Practitioner

## 2021-01-16 VITALS — BP 182/98 | HR 76 | Temp 97.0°F | Ht 66.0 in | Wt 187.2 lb

## 2021-01-16 DIAGNOSIS — Z7689 Persons encountering health services in other specified circumstances: Secondary | ICD-10-CM

## 2021-01-16 DIAGNOSIS — Z114 Encounter for screening for human immunodeficiency virus [HIV]: Secondary | ICD-10-CM | POA: Diagnosis not present

## 2021-01-16 DIAGNOSIS — Z125 Encounter for screening for malignant neoplasm of prostate: Secondary | ICD-10-CM

## 2021-01-16 DIAGNOSIS — Z6830 Body mass index (BMI) 30.0-30.9, adult: Secondary | ICD-10-CM

## 2021-01-16 DIAGNOSIS — Z0001 Encounter for general adult medical examination with abnormal findings: Secondary | ICD-10-CM | POA: Diagnosis not present

## 2021-01-16 DIAGNOSIS — Z1211 Encounter for screening for malignant neoplasm of colon: Secondary | ICD-10-CM | POA: Diagnosis not present

## 2021-01-16 DIAGNOSIS — Z0000 Encounter for general adult medical examination without abnormal findings: Secondary | ICD-10-CM

## 2021-01-16 DIAGNOSIS — I10 Essential (primary) hypertension: Secondary | ICD-10-CM

## 2021-01-16 DIAGNOSIS — L84 Corns and callosities: Secondary | ICD-10-CM

## 2021-01-16 DIAGNOSIS — Z1159 Encounter for screening for other viral diseases: Secondary | ICD-10-CM

## 2021-01-16 MED ORDER — HYDROCHLOROTHIAZIDE 25 MG PO TABS: 25.0000 mg | ORAL_TABLET | Freq: Every day | ORAL | 0 refills | Status: DC

## 2021-01-16 NOTE — Progress Notes (Signed)
Subjective:    Patient ID: William Barajas, male    DOB: 1955-10-05, 65 y.o.   MRN: [id number]  HPI: William Barajas is a 65 y.o. male presents for new patient visit to establish care.  Introduced to Designer, jewellery role and practice setting.  All questions answered.  Discussed provider/patient relationship and expectations.  Chief Complaint  Patient presents with  . Establish Care  . Annual Exam  . Hypertension   Lives with sister - [place] Patient reports he used to be on blood pressure medication.  Thinks it was HCTZ.  Has not been on any since previous PCP retired a couple of years ago. Hypertension status: uncontrolled  Satisfied with current treatment? no Duration of hypertension: chronic BP monitoring frequency:  not checking BP medication side effects:  no Medication compliance: not currently taking medication Aspirin: no Recurrent headaches: no Visual changes: no Palpitations: no Dyspnea: no Chest pain: no Lower extremity edema: no Dizzy/lightheaded: no  SKIN LESION Patient also reports a sore place on the bottom of his right foot - is asking for referral to "foot doctor."  He reports he saw a foot doctor in [place] in the past for the same and had a few freezing treatments. Duration: years Location: bottom of right foot Painful: yes Itching: no Onset: gradual Context: stable History of skin cancer: no History of precancerous skin lesions: no Family history of skin cancer: no  No Known Allergies  Outpatient Encounter Medications as of 01/16/2021  Medication Sig  . hydrochlorothiazide (HYDRODIURIL) 25 MG tablet Take 1 tablet (25 mg total) by mouth daily.  . [DISCONTINUED] acetaminophen (TYLENOL) 500 MG tablet Take 1 tablet (500 mg total) by mouth every 6 (six) hours as needed.  . [DISCONTINUED] cephALEXin (KEFLEX) 500 MG capsule Take 1 capsule (500 mg total) by mouth 4 (four) times daily.  . [DISCONTINUED] hydrochlorothiazide  (HYDRODIURIL) 25 MG tablet Take 1 tablet (25 mg total) by mouth daily.   No facility-administered encounter medications on file as of 01/16/2021.    Patient Active Problem List   Diagnosis Date Noted  . Essential hypertension 01/16/2021  . BMI 30.0-30.9,adult 01/16/2021  . Callus of foot 01/16/2021    Past Medical History:  Diagnosis Date  . Bursitis, shoulder 10/30/2012  . Carpal tunnel syndrome on left 11/08/2012  . Hypertension   . Muscle weakness (generalized) 12/01/2012  . Pain in joint, shoulder region 12/01/2012  . Rotator cuff syndrome of left shoulder 12/01/2012   Review of Systems  Constitutional: Negative.  Negative for activity change, appetite change, fatigue and fever.  HENT: Negative.   Eyes: Negative.  Negative for visual disturbance.  Respiratory: Negative.  Negative for cough, chest tightness, shortness of breath and wheezing.   Cardiovascular: Negative.  Negative for chest pain, palpitations and leg swelling.  Gastrointestinal: Negative.  Negative for abdominal pain, anal bleeding, blood in stool, constipation and diarrhea.  Genitourinary: Negative.  Negative for decreased urine volume, difficulty urinating, dysuria, hematuria, penile swelling, testicular pain and urgency.  Musculoskeletal: Negative.   Skin: Positive for wound (bottom of right foot).  Neurological: Negative.  Negative for dizziness, weakness, light-headedness and headaches.  Hematological: Negative.   Psychiatric/Behavioral: Negative.    Per HPI unless specifically indicated above    Objective:    BP (!) 182/98 (BP Location: Right Arm, Patient Position: Sitting, Cuff Size: Normal)   Pulse 76   Temp (!) 97 F (36.1 C) (Temporal)   Ht 5\' 6"  (1.676 m)  Wt 187 lb 3.2 oz (84.9 kg)   SpO2 98%   BMI 30.21 kg/m   Wt Readings from Last 3 Encounters:  01/16/21 187 lb 3.2 oz (84.9 kg)  01/15/20 168 lb (76.2 kg)  01/31/17 180 lb (81.6 kg)    Physical Exam Vitals and nursing note reviewed.   Constitutional:      General: He is not in acute distress.    Appearance: Normal appearance. He is not toxic-appearing.  HENT:     Head: Normocephalic and atraumatic.     Right Ear: Tympanic membrane, ear canal and external ear normal.     Left Ear: Tympanic membrane, ear canal and external ear normal.     Nose: Nose normal. No congestion or rhinorrhea.     Mouth/Throat:     Mouth: Mucous membranes are moist.     Pharynx: Oropharynx is clear. No oropharyngeal exudate or posterior oropharyngeal erythema.  Eyes:     General: No scleral icterus.       Right eye: No discharge.        Left eye: No discharge.  Cardiovascular:     Rate and Rhythm: Normal rate and regular rhythm.     Heart sounds: Normal heart sounds. No murmur heard.   Pulmonary:     Effort: Pulmonary effort is normal. No respiratory distress.     Breath sounds: Normal breath sounds. No wheezing, rhonchi or rales.  Abdominal:     General: Abdomen is flat. Bowel sounds are normal.     Palpations: Abdomen is soft.     Tenderness: There is no abdominal tenderness. There is no right CVA tenderness or left CVA tenderness.  Musculoskeletal:        General: Normal range of motion.     Cervical back: Normal range of motion.     Right lower leg: 1+ Edema present.     Left lower leg: 1+ Edema present.       Feet:  Feet:     Right foot:     Skin integrity: Callus present.     Comments: Light colored, hardened, circular callus noted to bottom of right foot. Skin:    General: Skin is warm and dry.     Capillary Refill: Capillary refill takes less than 2 seconds.     Coloration: Skin is not jaundiced or pale.     Findings: No erythema.  Neurological:     Mental Status: He is alert and oriented to person, place, and time.     Motor: No weakness.     Gait: Gait normal.  Psychiatric:        Mood and Affect: Mood normal.        Behavior: Behavior normal.        Thought Content: Thought content normal.        Judgment:  Judgment normal.       Assessment & Plan:  1. Annual physical exam  2. Encounter to establish care with new doctor  3. Essential hypertension Chronic.  Has been out of medication for over 1 year.  BP elevated in clinic today, however patient is not symptomatic.  Will resume previous HCTZ 25 mg for now - may need to adjust pending lab work.  Plan to follow up based on blood work - likely in 4 weeks.  - Lipid Panel - COMPLETE METABOLIC PANEL WITH GFR - CBC with Differential - hydrochlorothiazide (HYDRODIURIL) 25 MG tablet; Take 1 tablet (25 mg total) by mouth daily.  Dispense: 30  tablet; Refill: 0  4. BMI 30.0-30.9,adult Will check cholesterol, kidney and liver function with electrolytes, and blood counts.  Discussed cutting back on sugary beverages and increasing water intake.  - Lipid Panel - COMPLETE METABOLIC PANEL WITH GFR - CBC with Differential  5. Screening for prostate cancer Discussed with patient - elected to proceed with prostate cancer screening today.  - PSA  6. Screening for HIV (human immunodeficiency virus)  - HIV Antibody (routine testing w rflx)  7. Encounter for hepatitis C screening test for low risk patient  - Hepatitis C antibody  8. Screening for colon cancer  - Ambulatory referral to Gastroenterology  9. Callus of foot Consistent with callus, suspect plantar wart under hardened skin.  Will place referral to Podiatry for further evaluation and management.  - Ambulatory referral to Podiatry   Follow up plan: Return for pending blood work.

## 2021-01-17 LAB — CBC WITH DIFFERENTIAL/PLATELET
MCV: 89.3 fL (ref 80.0–100.0)
MPV: 10.9 fL (ref 7.5–12.5)

## 2021-01-17 LAB — COMPLETE METABOLIC PANEL WITH GFR
ALT: 17 U/L (ref 9–46)
Albumin: 4.1 g/dL (ref 3.6–5.1)
Calcium: 9.5 mg/dL (ref 8.6–10.3)
Globulin: 2.5 g/dL (calc) (ref 1.9–3.7)
Total Bilirubin: 0.5 mg/dL (ref 0.2–1.2)

## 2021-01-17 LAB — LIPID PANEL: Triglycerides: 135 mg/dL (ref <150)

## 2021-01-19 LAB — COMPLETE METABOLIC PANEL WITH GFR
AG Ratio: 1.6 (calc) (ref 1.0–2.5)
AST: 18 U/L (ref 10–35)
Alkaline phosphatase (APISO): 90 U/L (ref 35–144)
BUN: 17 mg/dL (ref 7–25)
CO2: 27 mmol/L (ref 20–32)
Chloride: 107 mmol/L (ref 98–110)
Creat: 1.19 mg/dL (ref 0.70–1.25)
GFR, Est African American: 74 mL/min/{1.73_m2} (ref >=60)
GFR, Est Non African American: 64 mL/min/{1.73_m2} (ref >=60)
Glucose, Bld: 61 mg/dL — ABNORMAL LOW (ref 65–99)
Potassium: 3.9 mmol/L (ref 3.5–5.3)
Sodium: 145 mmol/L (ref 135–146)
Total Protein: 6.6 g/dL (ref 6.1–8.1)

## 2021-01-19 LAB — CBC WITH DIFFERENTIAL/PLATELET
Absolute Monocytes: 595 cells/uL (ref 200–950)
Basophils Absolute: 19 cells/uL (ref 0–200)
Basophils Relative: 0.3 %
Eosinophils Absolute: 267 cells/uL (ref 15–500)
Eosinophils Relative: 4.3 %
HCT: 42.4 % (ref 38.5–50.0)
Hemoglobin: 14.5 g/dL (ref 13.2–17.1)
Lymphs Abs: 992 cells/uL (ref 850–3900)
MCH: 30.5 pg (ref 27.0–33.0)
MCHC: 34.2 g/dL (ref 32.0–36.0)
Monocytes Relative: 9.6 %
Neutro Abs: 4328 cells/uL (ref 1500–7800)
Neutrophils Relative %: 69.8 %
Platelets: 228 10*3/uL (ref 140–400)
RBC: 4.75 10*6/uL (ref 4.20–5.80)
RDW: 12.7 % (ref 11.0–15.0)
Total Lymphocyte: 16 %
WBC: 6.2 10*3/uL (ref 3.8–10.8)

## 2021-01-19 LAB — LIPID PANEL
Cholesterol: 195 mg/dL (ref <200)
HDL: 63 mg/dL (ref >=40)
LDL Cholesterol (Calc): 107 mg/dL (calc) — ABNORMAL HIGH
Non-HDL Cholesterol (Calc): 132 mg/dL (calc) — ABNORMAL HIGH (ref <130)
Total CHOL/HDL Ratio: 3.1 (calc) (ref <5.0)

## 2021-01-19 LAB — HIV ANTIBODY (ROUTINE TESTING W REFLEX): HIV 1&2 Ab, 4th Generation: NONREACTIVE

## 2021-01-19 LAB — HEPATITIS C ANTIBODY
Hepatitis C Ab: NONREACTIVE
SIGNAL TO CUT-OFF: 0.01 (ref <1.00)

## 2021-01-19 LAB — PSA: PSA: 1.31 ng/mL (ref <=4.00)

## 2021-01-20 ENCOUNTER — Telehealth: Payer: Self-pay

## 2021-01-20 NOTE — Telephone Encounter (Signed)
done

## 2021-01-20 NOTE — Telephone Encounter (Signed)
Please have referral redirected

## 2021-01-30 ENCOUNTER — Ambulatory Visit: Payer: 59 | Admitting: Podiatry

## 2021-01-30 ENCOUNTER — Other Ambulatory Visit: Payer: Self-pay

## 2021-01-30 ENCOUNTER — Encounter: Payer: Self-pay | Admitting: Podiatry

## 2021-01-30 VITALS — BP 170/78 | Temp 99.2°F

## 2021-01-30 DIAGNOSIS — M79675 Pain in left toe(s): Secondary | ICD-10-CM | POA: Diagnosis not present

## 2021-01-30 DIAGNOSIS — M79671 Pain in right foot: Secondary | ICD-10-CM | POA: Diagnosis not present

## 2021-01-30 DIAGNOSIS — Q828 Other specified congenital malformations of skin: Secondary | ICD-10-CM | POA: Diagnosis not present

## 2021-01-30 DIAGNOSIS — M79672 Pain in left foot: Secondary | ICD-10-CM

## 2021-01-30 DIAGNOSIS — M79674 Pain in right toe(s): Secondary | ICD-10-CM

## 2021-01-30 DIAGNOSIS — B351 Tinea unguium: Secondary | ICD-10-CM | POA: Diagnosis not present

## 2021-01-30 DIAGNOSIS — L84 Corns and callosities: Secondary | ICD-10-CM

## 2021-01-30 DIAGNOSIS — M216X9 Other acquired deformities of unspecified foot: Secondary | ICD-10-CM

## 2021-02-02 NOTE — Progress Notes (Signed)
Subjective: William Barajas presents today referred by Eulogio Bear, NP for complaint of callus(es) b/l feet for the past several week. Painful calluses are aggravated when weightbearing with and without shoegear. Pain is relieved with periodic professional debridement. He has seen Podiatrist in the past.   He also has  painful thick toenails that are difficult to trim. Painful toenails interfere with ambulation. Aggravating factors include wearing enclosed shoe gear. Pain is relieved with periodic professional debridement.   PCP is Noemi Chapel. Last visit was 01/16/2021.  Past Medical History:  Diagnosis Date  . Bursitis, shoulder 10/30/2012  . Carpal tunnel syndrome on left 11/08/2012  . Hypertension   . Muscle weakness (generalized) 12/01/2012  . Pain in joint, shoulder region 12/01/2012  . Rotator cuff syndrome of left shoulder 12/01/2012     Patient Active Problem List   Diagnosis Date Noted  . Essential hypertension 01/16/2021  . BMI 30.0-30.9,adult 01/16/2021  . Callus of foot 01/16/2021     Past Surgical History:  Procedure Laterality Date  . KNEE SURGERY Right      Current Outpatient Medications on File Prior to Visit  Medication Sig Dispense Refill  . hydrochlorothiazide (HYDRODIURIL) 25 MG tablet Take 1 tablet (25 mg total) by mouth daily. 30 tablet 0   No current facility-administered medications on file prior to visit.    No Known Allergies   Social History   Occupational History  . Not on file  Tobacco Use  . Smoking status: Never Smoker  . Smokeless tobacco: Never Used  Substance and Sexual Activity  . Alcohol use: No  . Drug use: No  . Sexual activity: Not on file     Family History  Problem Relation Age of Onset  . Arthritis Mother   . Cancer Other   . Asthma Other   . Diabetes Other      Immunization History  Administered Date(s) Administered  . Moderna SARS-COV2 Booster Vaccination 12/17/2020  . Moderna Sars-Covid-2 Vaccination  10/02/2019, 10/30/2019    Objective: INDY KUCK is a pleasant 65 y.o. male WD, WN in NAD. AAO x 3.  Vitals:   01/30/21 1610  BP: (!) 170/78  Temp: 99.2 F (37.3 C)   Vascular Examination:  Capillary refill time to digits immediate b/l. Palpable pedal pulses b/l LE. Pedal hair sparse. Lower extremity skin temperature gradient within normal limits. No pain with calf compression b/l.  Dermatological Examination: Pedal skin with normal turgor, texture and tone bilaterally. No open wounds bilaterally. No interdigital macerations bilaterally. Toenails 1-5 b/l elongated, discolored, dystrophic, thickened, crumbly with subungual debris and tenderness to dorsal palpation. Hyperkeratotic lesion(s) submet head 2 left foot and submet head 5 left foot.  No erythema, no edema, no drainage, no fluctuance. Porokeratotic lesion(s) submet head 2 right foot and submet head 3 left foot. No erythema, no edema, no drainage, no fluctuance.  Musculoskeletal: Normal muscle strength 5/5 to all lower extremity muscle groups bilaterally. No pain crepitus or joint limitation noted with ROM b/l. Plantarflexed metatarsal(s) b/l feet.  Neurological: Protective sensation intact 5/5 intact bilaterally with 10g monofilament b/l. Vibratory sensation intact b/l. Clonus negative b/l.  Assessment: 1. Pain due to onychomycosis of toenails of both feet   2. Porokeratosis   3. Callus   4. Plantar flexed metatarsal, unspecified laterality   5. Pain in both feet    Plan: -Examined patient. -Patient to continue soft, supportive shoe gear daily. -Discussed topical, laser and oral medication for onychomycosis. Patient opted for toenail debridement  only on today.  -Toenails 1-5 b/l were debrided in length and girth with sterile nail nippers and dremel without iatrogenic bleeding.  -Callus(es) submet head 2 left foot and submet head 5 left foot pared utilizing sterile scalpel blade without complication or incident. Total  number debrided =2. -Painful porokeratotic lesion(s) submet head 2 right foot and submet head 3 left foot pared and enucleated with sterile scalpel blade without incident. Total number of lesions debrided=2. -We will check his benefits for custom orthotics to offload his painful plantar lesions. -Patient to report any pedal injuries to medical professional immediately. -Patient/POA to call should there be question/concern in the interim.  Return in about 3 months (around 05/02/2021).  Marzetta Board, DPM

## 2021-02-13 ENCOUNTER — Encounter: Payer: Self-pay | Admitting: *Deleted

## 2021-02-16 ENCOUNTER — Other Ambulatory Visit: Payer: Self-pay

## 2021-02-16 ENCOUNTER — Ambulatory Visit: Payer: 59 | Admitting: Nurse Practitioner

## 2021-02-16 DIAGNOSIS — I10 Essential (primary) hypertension: Secondary | ICD-10-CM

## 2021-02-16 MED ORDER — HYDROCHLOROTHIAZIDE 25 MG PO TABS: 25.0000 mg | ORAL_TABLET | Freq: Every day | ORAL | 0 refills | Status: DC

## 2021-03-04 ENCOUNTER — Other Ambulatory Visit: Payer: Self-pay

## 2021-03-04 ENCOUNTER — Ambulatory Visit (INDEPENDENT_AMBULATORY_CARE_PROVIDER_SITE_OTHER): Payer: Self-pay | Admitting: *Deleted

## 2021-03-04 VITALS — Ht 66.0 in | Wt 187.8 lb

## 2021-03-04 DIAGNOSIS — Z1211 Encounter for screening for malignant neoplasm of colon: Secondary | ICD-10-CM

## 2021-03-04 MED ORDER — CLENPIQ 10-3.5-12 MG-GM -GM/160ML PO SOLN: 1.0000 | Freq: Once | ORAL | 0 refills | Status: AC

## 2021-03-04 NOTE — Patient Instructions (Signed)
[place]   Patient Name:  William Barajas Date of procedure:  03/13/2021 Time to register at [place] Stay:  11:00 AM Provider:  Dr. Abbey Chatters  Please notify us immediately if you are diabetic, take iron supplements, or if you are on coumadin or any blood thinners.  Please hold the following medications: n/a  Note: Do NOT refrigerate or freeze CLENPIQ. CLENPIQ is ready to drink. There is no need to add any other liquid or mix the medicine in the bottle before you start dosing.   03/12/2021-  1 Day prior to procedure:    CLEAR LIQUIDS ALL DAY--NO SOLID FOODS OR DAIRY PRODUCTS! See list of liquids that are allowed and items that are NOT allowed below.  Diabetic Medication Instructions:  n/a  You must drink plenty of CLEAR LIQUIDS starting before your bowel prep. It is important to stay adequately hydrated before, during, and after your bowel prep for the prep to work effectively!  At 4:00 PM Begin the prep as follows:    Drink one bottle of pre-mixed CLENPIQ right from the bottle.  Drink at least five (5) 8-ounce drinks of clear liquids of your choice within the next 5 hours  At 10:00 PM: Drink the second bottle of pre-mixed CLENPIQ right from the bottle.   Drink at least three (3) 8-ounce drinks of clear liquids of your choice within the next 3 hours before going to bed.  Continue clear liquids.    03/13/2021-  Day of Procedure:  Diabetic medications adjustments: n/a  You may take TYLENOL products.  Please continue your regular medications unless we have instructed you otherwise.    At 3 hours before procedure @ 9:30 am: Stop drinking all liquids, nothing by mouth at this point.   Please note, on the day of your procedure you MUST be accompanied by an adult who is willing to assume responsibility for you at time of discharge. If you do not have such person with you, your procedure will have to be rescheduled.                                                                                                                      Please leave ALL jewelry at home prior to coming to the hospital for your procedure.   *It is your responsibility to check with your insurance company for the benefits of coverage you have for this procedure. Unfortunately, not all insurance companies have benefits to cover all or part of these types of procedures. It is your responsibility to check your benefits, however we will be glad to assist you with any codes your insurance company may need.   Please note that most insurance companies will not cover a screening colonoscopy for people under the age of 20  For example, with some insurance companies you may have benefits for a screening colonoscopy, but if polyps are found the diagnosis will change and then you may have a deductible that will need to be met.  Please make sure you check your benefits for screening colonoscopy as well as a diagnostic colonoscopy.    CLEAR LIQUIDS: (NO RED or PURPLE) Water  Jello   Apple Juice  White Grape Juice   Kool-Aid Soft drinks  Banana popsicles Sports Drink  Black coffee (No cream or milk) Tea (No cream or milk)  Broth (fat free beef/chicken/vegetable)  Clear liquids allow you to see your fingers on the other side of the glass.  Be sure they are NOT RED or PURPLE in color, cloudy, but CLEAR.  Do Not Eat: Dairy products of any kind Cranberry juice Tomato or V8 Juice  Orange Juice   Grapefruit Juice Red Grape Juice Alcohol   Non-dairy creamer Solid foods like cereal, oatmeal, yogurt, fruits, vegetables, creamed soups, eggs, bread, etc   HELPFUL HINTS TO MAKE DRINKING EASIER: -Trying drinking through a straw. -If you become nauseated, try consuming smaller amounts or stretch out the time between glasses.  Stop for 30 minutes & slowly start back drinking.  Call our office with any questions or concerns at [phone].  Thank You,  Christ Kick, [place]

## 2021-03-04 NOTE — Progress Notes (Signed)
Ok to schedule.  ASA II 

## 2021-03-04 NOTE — Progress Notes (Addendum)
Gastroenterology Pre-Procedure Review  Request Date: 03/04/2021 Requesting Physician: Noemi Chapel, NP, no previous TCS, family hx of colon cancer (mother)  PATIENT REVIEW QUESTIONS: The patient responded to the following health history questions as indicated:    1. Diabetes Melitis: no 2. Joint replacements in the past 12 months: no 3. Major health problems in the past 3 months: no 4. Has an artificial valve or MVP: no 5. Has a defibrillator: no 6. Has been advised in past to take antibiotics in advance of a procedure like teeth cleaning: no 7. Family history of colon cancer: yes, mother: age 30's  8. Alcohol Use: no 9. Illicit drug Use: no 10. History of sleep apnea:  no 11. History of coronary artery or other vascular stents placed within the last 12 months: no 12. History of any prior anesthesia complications: no 13. Body mass index is 30.31 kg/m.    MEDICATIONS & ALLERGIES:    Patient reports the following regarding taking any blood thinners:   Plavix? no Aspirin? no Coumadin? no Brilinta? no Xarelto? no Eliquis? no Pradaxa? no Savaysa? no Effient? no  Patient confirms/reports the following medications:  Current Outpatient Medications  Medication Sig Dispense Refill   hydrochlorothiazide (HYDRODIURIL) 25 MG tablet Take 1 tablet (25 mg total) by mouth daily. 30 tablet 0   No current facility-administered medications for this visit.    Patient confirms/reports the following allergies:  No Known Allergies  No orders of the defined types were placed in this encounter.   AUTHORIZATION INFORMATION Primary Insurance: [employer] [place],  [place] #: [id number],  Group #: [id number] Pre-Cert / Josem Kaufmann required: No, not required per [facility] / Auth #: [id number]  SCHEDULE INFORMATION: Procedure has been scheduled as follows:  Date: 03/13/2021, Time: 12:30  Location: [facility] with Dr. Abbey Chatters  This Gastroenterology Pre-Precedure Review Form is being routed to the following  provider(s): Neil Crouch, PA-C

## 2021-03-05 ENCOUNTER — Other Ambulatory Visit: Payer: Self-pay | Admitting: *Deleted

## 2021-03-05 DIAGNOSIS — Z139 Encounter for screening, unspecified: Secondary | ICD-10-CM

## 2021-03-06 ENCOUNTER — Other Ambulatory Visit: Payer: Self-pay | Admitting: *Deleted

## 2021-03-11 ENCOUNTER — Other Ambulatory Visit (HOSPITAL_COMMUNITY)
Admission: RE | Admit: 2021-03-11 | Discharge: 2021-03-11 | Disposition: A | Discharge status: Home / Self Care | Payer: 59 | Source: Ambulatory Visit | Attending: Internal Medicine | Admitting: Internal Medicine

## 2021-03-11 DIAGNOSIS — Z139 Encounter for screening, unspecified: Secondary | ICD-10-CM | POA: Insufficient documentation

## 2021-03-11 LAB — BASIC METABOLIC PANEL
Anion gap: 7 (ref 5–15)
BUN: 21 mg/dL (ref 8–23)
CO2: 26 mmol/L (ref 22–32)
Calcium: 9.3 mg/dL (ref 8.9–10.3)
Chloride: 107 mmol/L (ref 98–111)
Creatinine, Ser: 1.24 mg/dL (ref 0.61–1.24)
GFR, Estimated: >60 mL/min (ref >60)
Glucose, Bld: 90 mg/dL (ref 70–99)
Potassium: 3.7 mmol/L (ref 3.5–5.1)
Sodium: 140 mmol/L (ref 135–145)

## 2021-03-13 ENCOUNTER — Encounter (HOSPITAL_COMMUNITY): Payer: Self-pay

## 2021-03-13 ENCOUNTER — Ambulatory Visit (HOSPITAL_COMMUNITY): Payer: 59 | Admitting: Anesthesiology

## 2021-03-13 ENCOUNTER — Encounter (HOSPITAL_COMMUNITY)
Admission: RE | Disposition: A | Discharge status: Home / Self Care | Payer: Self-pay | Source: Home / Self Care | Attending: Internal Medicine

## 2021-03-13 ENCOUNTER — Other Ambulatory Visit: Payer: Self-pay

## 2021-03-13 ENCOUNTER — Ambulatory Visit (HOSPITAL_COMMUNITY)
Admission: RE | Admit: 2021-03-13 | Discharge: 2021-03-13 | Disposition: A | Discharge status: Home / Self Care | Payer: 59 | Attending: Internal Medicine | Admitting: Internal Medicine

## 2021-03-13 DIAGNOSIS — D124 Benign neoplasm of descending colon: Secondary | ICD-10-CM | POA: Insufficient documentation

## 2021-03-13 DIAGNOSIS — Z800 Family history of malignant neoplasm of digestive organs: Secondary | ICD-10-CM | POA: Diagnosis not present

## 2021-03-13 DIAGNOSIS — K635 Polyp of colon: Secondary | ICD-10-CM

## 2021-03-13 DIAGNOSIS — Z79899 Other long term (current) drug therapy: Secondary | ICD-10-CM | POA: Diagnosis not present

## 2021-03-13 DIAGNOSIS — K648 Other hemorrhoids: Secondary | ICD-10-CM | POA: Diagnosis not present

## 2021-03-13 DIAGNOSIS — I10 Essential (primary) hypertension: Secondary | ICD-10-CM | POA: Diagnosis not present

## 2021-03-13 DIAGNOSIS — Z1211 Encounter for screening for malignant neoplasm of colon: Secondary | ICD-10-CM | POA: Diagnosis not present

## 2021-03-13 DIAGNOSIS — K5730 Diverticulosis of large intestine without perforation or abscess without bleeding: Secondary | ICD-10-CM | POA: Diagnosis not present

## 2021-03-13 HISTORY — PX: POLYPECTOMY: SHX5525

## 2021-03-13 HISTORY — PX: COLONOSCOPY WITH PROPOFOL: SHX5780

## 2021-03-13 SURGERY — COLONOSCOPY WITH PROPOFOL
Anesthesia: General

## 2021-03-13 MED ORDER — LIDOCAINE HCL (CARDIAC) PF 100 MG/5ML IV SOSY
PREFILLED_SYRINGE | INTRAVENOUS | Status: DC | PRN
  Administered 2021-03-13: 30 mg via INTRAVENOUS

## 2021-03-13 MED ORDER — LACTATED RINGERS IV SOLN: INTRAVENOUS | Status: DC

## 2021-03-13 MED ORDER — PROPOFOL 10 MG/ML IV BOLUS
INTRAVENOUS | Status: DC | PRN
  Administered 2021-03-13: 80 mg via INTRAVENOUS
  Administered 2021-03-13: 125 ug/kg/min via INTRAVENOUS

## 2021-03-13 NOTE — H&P (Signed)
Primary Care Physician:  Eulogio Bear, NP Primary Gastroenterologist:  Dr. Abbey Chatters  Pre-Procedure History & Physical: HPI:  William Barajas is a 65 y.o. male is here for a colonoscopy to be performed for high risk colon cancer screening purposes due to family history of colon cancer (mother). No previous TCS.   Past Medical History:  Diagnosis Date   Bursitis, shoulder 10/30/2012   Carpal tunnel syndrome on left 11/08/2012   Hypertension    Muscle weakness (generalized) 12/01/2012   Pain in joint, shoulder region 12/01/2012   Rotator cuff syndrome of left shoulder 12/01/2012    Past Surgical History:  Procedure Laterality Date   KNEE SURGERY Right     Prior to Admission medications   Medication Sig Start Date End Date Taking? Authorizing Provider  hydrochlorothiazide (HYDRODIURIL) 25 MG tablet Take 1 tablet (25 mg total) by mouth daily. 02/16/21  Yes Eulogio Bear, NP    Allergies as of 03/05/2021   (No Known Allergies)    Family History  Problem Relation Age of Onset   Arthritis Mother    Cancer Other    Asthma Other    Diabetes Other     Social History   Socioeconomic History   Marital status: Single    Spouse name: Not on file   Number of children: Not on file   Years of education: Not on file   Highest education level: Not on file  Occupational History   Not on file  Tobacco Use   Smoking status: Never   Smokeless tobacco: Never  Substance and Sexual Activity   Alcohol use: No   Drug use: No   Sexual activity: Not on file  Other Topics Concern   Not on file  Social History Narrative   Not on file   Social Determinants of Health   Financial Resource Strain: Not on file  Food Insecurity: Not on file  Transportation Needs: Not on file  Physical Activity: Not on file  Stress: Not on file  Social Connections: Not on file  Intimate Partner Violence: Not on file    Review of Systems: See HPI, otherwise negative ROS  Physical Exam: Vital  signs in last 24 hours: Weight:  [80.7 kg] 80.7 kg (07/08 1114)   General:   Alert,  Well-developed, well-nourished, pleasant and cooperative in NAD Head:  Normocephalic and atraumatic. Eyes:  Sclera clear, no icterus.   Conjunctiva pink. Ears:  Normal auditory acuity. Nose:  No deformity, discharge,  or lesions. Mouth:  No deformity or lesions, dentition normal. Neck:  Supple; no masses or thyromegaly. Lungs:  Clear throughout to auscultation.   No wheezes, crackles, or rhonchi. No acute distress. Heart:  Regular rate and rhythm; no murmurs, clicks, rubs,  or gallops. Abdomen:  Soft, nontender and nondistended. No masses, hepatosplenomegaly or hernias noted. Normal bowel sounds, without guarding, and without rebound.   Msk:  Symmetrical without gross deformities. Normal posture. Extremities:  Without clubbing or edema. Neurologic:  Alert and  oriented x4;  grossly normal neurologically. Skin:  Intact without significant lesions or rashes. Cervical Nodes:  No significant cervical adenopathy. Psych:  Alert and cooperative. Normal mood and affect.  Impression/Plan: William Barajas is here for a colonoscopy to be performed for high risk colon cancer screening purposes due to family history of colon cancer (mother). No previous TCS.   The risks of the procedure including infection, bleed, or perforation as well as benefits, limitations, alternatives and imponderables have been reviewed with the  patient. Questions have been answered. All parties agreeable.

## 2021-03-13 NOTE — Anesthesia Postprocedure Evaluation (Signed)
Anesthesia Post Note  Patient: William Barajas  Procedure(s) Performed: COLONOSCOPY WITH PROPOFOL POLYPECTOMY  Patient location during evaluation: Endoscopy Anesthesia Type: General Level of consciousness: awake and alert and oriented Pain management: pain level controlled Vital Signs Assessment: post-procedure vital signs reviewed and stable Respiratory status: spontaneous breathing and respiratory function stable Cardiovascular status: blood pressure returned to baseline and stable Postop Assessment: no apparent nausea or vomiting Anesthetic complications: no   No notable events documented.   Last Vitals:  Vitals:   03/13/21 1156 03/13/21 1205  BP: (!) 105/57 121/65  Pulse: 78 74  Resp: (!) 21 (!) 23  Temp: 36.5 C   SpO2: 98% 97%    Last Pain:  Vitals:   03/13/21 1205  TempSrc:   PainSc: 0-No pain                 Jonnie Kubly C Emonni Depasquale

## 2021-03-13 NOTE — Op Note (Signed)
[facility] Patient Name: William Barajas Procedure Date: 03/13/2021 11:17 AM MRN: [id number] Date of Birth: Feb 21, 1956 Attending MD: Elon Alas. Edgar Frisk CSN: [id number] Age: 65 Admit Type: Outpatient Procedure:                Colonoscopy Indications:              Screening for colorectal malignant neoplasm Providers:                Elon Alas. Abbey Chatters, DO, Gwenlyn Fudge, RN, Randa Spike, Technician Referring MD:              Medicines:                See the Anesthesia note for documentation of the                            administered medications Complications:            No immediate complications. Estimated Blood Loss:     Estimated blood loss was minimal. Procedure:                Pre-Anesthesia Assessment:                           - The anesthesia plan was to use monitored                            anesthesia care (MAC).                           After obtaining informed consent, the colonoscope                            was passed under direct vision. Throughout the                            procedure, the patient's blood pressure, pulse, and                            oxygen saturations were monitored continuously. The                            [id number]-[id number] ([id number]) scope was introduced through                            the anus and advanced to the the cecum, identified                            by appendiceal orifice and ileocecal valve. The                            colonoscopy was performed without difficulty. The                            patient tolerated the procedure well. The  quality                            of the bowel preparation was evaluated using the                            BBPS [facility] Bowel Preparation Scale) with scores                            of: Right Colon = 2 (minor amount of residual                            staining, small fragments of stool and/or opaque                            liquid, but mucosa  seen well), Transverse Colon = 3                            (entire mucosa seen well with no residual staining,                            small fragments of stool or opaque liquid) and Left                            Colon = 3 (entire mucosa seen well with no residual                            staining, small fragments of stool or opaque                            liquid). The total BBPS score equals 8. The quality                            of the bowel preparation was good. Scope In: 11:39:22 AM Scope Out: 11:53:14 AM Scope Withdrawal Time: 0 hours 7 minutes 9 seconds  Total Procedure Duration: 0 hours 13 minutes 52 seconds  Findings:      The perianal and digital rectal examinations were normal.      Non-bleeding internal hemorrhoids were found during endoscopy.      Multiple small and large-mouthed diverticula were found in the sigmoid       colon and descending colon.      A 5 mm polyp was found in the descending colon. The polyp was sessile.       The polyp was removed with a cold snare. Resection and retrieval were       complete. Impression:               - Non-bleeding internal hemorrhoids.                           - Diverticulosis in the sigmoid colon and in the                            descending colon.                           -  One 5 mm polyp in the descending colon, removed                            with a cold snare. Resected and retrieved. Moderate Sedation:      Per Anesthesia Care Recommendation:           - Patient has a contact number available for                            emergencies. The signs and symptoms of potential                            delayed complications were discussed with the                            patient. Return to normal activities tomorrow.                            Written discharge instructions were provided to the                            patient.                           - Resume previous diet.                           -  Continue present medications.                           - Await pathology results.                           - Repeat colonoscopy in 5 years for surveillance.                           - Return to GI clinic PRN. Procedure Code(s):        --- Professional ---                           [phone], Colonoscopy, flexible; with removal of                            tumor(s), polyp(s), or other lesion(s) by snare                            technique Diagnosis Code(s):        --- Professional ---                           Z12.11, Encounter for screening for malignant                            neoplasm of colon                           K64.8, Other hemorrhoids  K63.5, Polyp of colon                           K57.30, Diverticulosis of large intestine without                            perforation or abscess without bleeding CPT copyright 2019 American Medical Association. All rights reserved. The codes documented in this report are preliminary and upon coder review may  be revised to meet current compliance requirements. Elon Alas. Abbey Chatters, DO [place] Abbey Chatters, DO 03/13/2021 11:57:31 AM This report has been signed electronically. Number of Addenda: 0

## 2021-03-13 NOTE — Anesthesia Preprocedure Evaluation (Signed)
Anesthesia Evaluation  Patient identified by MRN, date of birth, ID band Patient awake    Reviewed: Allergy & Precautions, NPO status , Patient's Chart, lab work & pertinent test results  Airway Mallampati: III  TM Distance: >3 FB Neck ROM: Full  Mouth opening: Limited Mouth Opening  Dental  (+) Dental Advisory Given, Missing   Pulmonary    Pulmonary exam normal breath sounds clear to auscultation       Cardiovascular Exercise Tolerance: Good hypertension, Pt. on medications  Rhythm:Regular Rate:Normal + Systolic murmurs    Neuro/Psych  Neuromuscular disease    GI/Hepatic negative GI ROS, Neg liver ROS,   Endo/Other  negative endocrine ROS  Renal/GU negative Renal ROS     Musculoskeletal negative musculoskeletal ROS (+)   Abdominal   Peds  Hematology negative hematology ROS (+)   Anesthesia Other Findings   Reproductive/Obstetrics negative OB ROS                             Anesthesia Physical Anesthesia Plan  ASA: 2  Anesthesia Plan: General   Post-op Pain Management:    Induction: Intravenous  PONV Risk Score and Plan: Propofol infusion  Airway Management Planned: Nasal Cannula and Natural Airway  Additional Equipment:   Intra-op Plan:   Post-operative Plan:   Informed Consent: I have reviewed the patients History and Physical, chart, labs and discussed the procedure including the risks, benefits and alternatives for the proposed anesthesia with the patient or authorized representative who has indicated his/her understanding and acceptance.     Dental advisory given  Plan Discussed with: CRNA and Surgeon  Anesthesia Plan Comments:         Anesthesia Quick Evaluation

## 2021-03-13 NOTE — Transfer of Care (Signed)
Immediate Anesthesia Transfer of Care Note  Patient: William Barajas  Procedure(s) Performed: COLONOSCOPY WITH PROPOFOL POLYPECTOMY  Patient Location: Endoscopy Unit  Anesthesia Type:General  Level of Consciousness: drowsy, patient cooperative and responds to stimulation  Airway & Oxygen Therapy: Patient Spontanous Breathing  Post-op Assessment: Report given to RN, Post -op Vital signs reviewed and stable and Patient moving all extremities X 4  Post vital signs: Reviewed and stable  Last Vitals:  Vitals Value Taken Time  BP    Temp    Pulse    Resp    SpO2      Last Pain:  Vitals:   03/13/21 1133  TempSrc:   PainSc: 0-No pain      Patients Stated Pain Goal: 10 (28/41/32 4401)  Complications: No notable events documented.

## 2021-03-13 NOTE — Discharge Instructions (Addendum)
Colonoscopy Discharge Instructions  Read the instructions outlined below and refer to this sheet in the next few weeks. These discharge instructions provide you with general information on caring for yourself after you leave the hospital. Your doctor may also give you specific instructions. While your treatment has been planned according to the most current medical practices available, unavoidable complications occasionally occur.   ACTIVITY You may resume your regular activity, but move at a slower pace for the next 24 hours.  Take frequent rest periods for the next 24 hours.  Walking will help get rid of the air and reduce the bloated feeling in your belly (abdomen).  No driving for 24 hours (because of the medicine (anesthesia) used during the test).   Do not sign any important legal documents or operate any machinery for 24 hours (because of the anesthesia used during the test).  NUTRITION Drink plenty of fluids.  You may resume your normal diet as instructed by your doctor.  Begin with a light meal and progress to your normal diet. Heavy or fried foods are harder to digest and may make you feel sick to your stomach (nauseated).  Avoid alcoholic beverages for 24 hours or as instructed.  MEDICATIONS You may resume your normal medications unless your doctor tells you otherwise.  WHAT YOU CAN EXPECT TODAY Some feelings of bloating in the abdomen.  Passage of more gas than usual.  Spotting of blood in your stool or on the toilet paper.  IF YOU HAD POLYPS REMOVED DURING THE COLONOSCOPY: No aspirin products for 7 days or as instructed.  No alcohol for 7 days or as instructed.  Eat a soft diet for the next 24 hours.  FINDING OUT THE RESULTS OF YOUR TEST Not all test results are available during your visit. If your test results are not back during the visit, make an appointment with your caregiver to find out the results. Do not assume everything is normal if you have not heard from your  caregiver or the medical facility. It is important for you to follow up on all of your test results.  SEEK IMMEDIATE MEDICAL ATTENTION IF: You have more than a spotting of blood in your stool.  Your belly is swollen (abdominal distention).  You are nauseated or vomiting.  You have a temperature over 101.  You have abdominal pain or discomfort that is severe or gets worse throughout the day.   Your colonoscopy revealed 1 polyp(s) which I removed successfully. Await pathology results, my office will contact you. I recommend repeating colonoscopy in 5 years for surveillance purposes. Follow up with GI as needed.   I hope you have a great rest of your week!  Elon Alas. Abbey Chatters, D.O. Gastroenterology and Hepatology [facility]  PATIENT INSTRUCTIONS POST-ANESTHESIA  IMMEDIATELY FOLLOWING SURGERY:  Do not drive or operate machinery for the first twenty four hours after surgery.  Do not make any important decisions for twenty four hours after surgery or while taking narcotic pain medications or sedatives.  If you develop intractable nausea and vomiting or a severe headache please notify your doctor immediately.  FOLLOW-UP:  Please make an appointment with your surgeon as instructed. You do not need to follow up with anesthesia unless specifically instructed to do so.  WOUND CARE INSTRUCTIONS (if applicable):  Keep a dry clean dressing on the anesthesia/puncture wound site if there is drainage.  Once the wound has quit draining you may leave it open to air.  Generally you should leave  the bandage intact for twenty four hours unless there is drainage.  If the epidural site drains for more than 36-48 hours please call the anesthesia department.  QUESTIONS?:  Please feel free to call your physician or the hospital operator if you have any questions, and they will be happy to assist you.

## 2021-03-13 NOTE — Progress Notes (Signed)
Please excuse William Barajas from work today and Saturday. He cannot drive, operate machinery or sign legal documents until after noon on Saturday 03/14/2021.

## 2021-03-16 LAB — SURGICAL PATHOLOGY

## 2021-03-19 ENCOUNTER — Encounter (HOSPITAL_COMMUNITY): Payer: Self-pay | Admitting: Internal Medicine

## 2021-05-08 ENCOUNTER — Ambulatory Visit: Payer: 59 | Admitting: Podiatry

## 2021-05-08 ENCOUNTER — Encounter: Payer: Self-pay | Admitting: Podiatry

## 2021-05-08 ENCOUNTER — Other Ambulatory Visit: Payer: Self-pay

## 2021-05-08 DIAGNOSIS — B353 Tinea pedis: Secondary | ICD-10-CM

## 2021-05-08 DIAGNOSIS — Q828 Other specified congenital malformations of skin: Secondary | ICD-10-CM | POA: Diagnosis not present

## 2021-05-08 DIAGNOSIS — L84 Corns and callosities: Secondary | ICD-10-CM

## 2021-05-08 DIAGNOSIS — M79671 Pain in right foot: Secondary | ICD-10-CM | POA: Diagnosis not present

## 2021-05-08 DIAGNOSIS — B351 Tinea unguium: Secondary | ICD-10-CM | POA: Diagnosis not present

## 2021-05-08 DIAGNOSIS — M79672 Pain in left foot: Secondary | ICD-10-CM | POA: Diagnosis not present

## 2021-05-08 DIAGNOSIS — L853 Xerosis cutis: Secondary | ICD-10-CM

## 2021-05-08 DIAGNOSIS — M79675 Pain in left toe(s): Secondary | ICD-10-CM

## 2021-05-08 DIAGNOSIS — M79674 Pain in right toe(s): Secondary | ICD-10-CM

## 2021-05-08 MED ORDER — CICLOPIROX OLAMINE 0.77 % EX CREA: TOPICAL_CREAM | CUTANEOUS | 1 refills | Status: DC

## 2021-05-08 NOTE — Patient Instructions (Signed)
For normal skin: Moisturize feet once daily; do not apply between toes A.  CeraVe Daily Moisturizing Lotion B.  Vaseline Intensive Care Lotion C.  Lubriderm Lotion D.  Gold Bond Diabetic Foot Lotion E.  Eucerin Intensive Repair Moisturizing Lotion  For extremely dry, cracked feet: moisturize feet once daily; do not apply between toes A. CeraVe Healing Ointment B. Aquaphor Healing Ointment C. Vaseline Petroleum Healing Jelly   If you have problems reaching your feet: apply to feet once daily; do not apply between toes A.  Aquaphor Advanced Therapy Ointment Body Spray B.  Vaseline Intensive Care Spray Lotion Advanced Repair    

## 2021-05-12 NOTE — Progress Notes (Signed)
  Subjective:  Patient ID: William Barajas, male    DOB: 06/20/1956,  MRN: [id number]  65 y.o. male presents for follow up of callus(es) left foot, painful porokeratotic lesion(s) b/l feet and painful mycotic toenails that limit ambulation. Painful toenails interfere with ambulation. Aggravating factors include wearing enclosed shoe gear. Pain is relieved with periodic professional debridement. Painful porokeratotic lesions are aggravated when weightbearing with and without shoegear. Pain is relieved with periodic professional debridement.  He is inquiring about status of orthotic benefits.  PCP is Eulogio Bear, NP , and last visit was 01/16/2021.  No Known Allergies  Review of Systems: Negative except as noted in the HPI.   Objective:  Vascular Examination: Vascular status intact b/l with palpable pedal pulses. CFT immediate b/l. No edema. No pain with calf compression b/l. Skin temperature gradient WNL b/l.   Neurological Examination: Sensation grossly intact b/l with 10 gram monofilament. Vibratory sensation intact b/l.   Dermatological Examination: Pedal skin with normal turgor, texture and tone b/l. Toenails 1-5 b/l thick, discolored, elongated with subungual debris and pain on dorsal palpation. Porokeratotic lesion(s) b/l lower extremities, submet head 2 right foot, and submet head 3 left foot. No erythema, no edema, no drainage, no fluctuance. Pedal skin noted to be dry and flaky b/l lower extremities. Diffuse scaling noted peripherally and plantarly b/l feet with mild foot odor.  No interdigital macerations.  No blisters, no weeping. No signs of secondary bacterial infection noted. Hyperkeratotic lesion(s) submet head 2 left foot and submet head 5 left foot with tenderness to palpation. No edema, no erythema, no drainage, no fluctuance.   Musculoskeletal Examination: Muscle strength 5/5 to b/l LE. Plantarflexed metatarsals b/l feet.  Radiographs: None Assessment:   1.  Pain due to onychomycosis of toenails of both feet   2. Porokeratosis   3. Callus   4. Dry skin   5. Tinea pedis of both feet   6. Pain in both feet     Plan:  -Examined patient. -He was casted for orthotics on today's visit. He will be contacted when they arrive.. -Patient to continue soft, supportive shoe gear daily. -Toenails 1-5 b/l were debrided in length and girth with sterile nail nippers and dremel without iatrogenic bleeding.  -Callus(es) submet head 2 left foot and submet head 5 left foot pared utilizing sterile scalpel blade without complication or incident. Total number debrided =2. -Painful porokeratotic lesion(s) submet head 2 right foot and submet head 3 left foot pared and enucleated with sterile scalpel blade without incident. Total number of lesions debrided=2. -Patient to report any pedal injuries to medical professional immediately. -For dry skin, patient was given written list of OTC moisturizers. Patient/POA instructed to apply to foot/feet once daily avoiding application between toes.  --For tinea pedis, Rx sent to pharmacy for Ciclopirox Cream 0.77% to be applied to b/l feet twice daily. -Patient/POA to call should there be question/concern in the interim.  Return in about 10 weeks (around 07/17/2021).  Marzetta Board, DPM

## 2021-06-11 ENCOUNTER — Telehealth: Payer: Self-pay | Admitting: Podiatry

## 2021-06-11 NOTE — Telephone Encounter (Signed)
Orthotics in.. called home number and mailbox is full, called other number and it was pts daughter and she is going to relay the message to call for an appt to pick them up.

## 2021-07-17 ENCOUNTER — Encounter: Payer: Self-pay | Admitting: Podiatry

## 2021-07-17 ENCOUNTER — Ambulatory Visit: Payer: 59 | Admitting: Podiatry

## 2021-07-17 ENCOUNTER — Other Ambulatory Visit: Payer: Self-pay

## 2021-07-17 DIAGNOSIS — Q828 Other specified congenital malformations of skin: Secondary | ICD-10-CM | POA: Diagnosis not present

## 2021-07-17 DIAGNOSIS — L84 Corns and callosities: Secondary | ICD-10-CM

## 2021-07-17 DIAGNOSIS — M79674 Pain in right toe(s): Secondary | ICD-10-CM | POA: Diagnosis not present

## 2021-07-17 DIAGNOSIS — M79675 Pain in left toe(s): Secondary | ICD-10-CM

## 2021-07-17 DIAGNOSIS — M79672 Pain in left foot: Secondary | ICD-10-CM | POA: Diagnosis not present

## 2021-07-17 DIAGNOSIS — M79671 Pain in right foot: Secondary | ICD-10-CM

## 2021-07-17 DIAGNOSIS — B351 Tinea unguium: Secondary | ICD-10-CM

## 2021-07-21 NOTE — Progress Notes (Signed)
  Subjective:  Patient ID: William Barajas, male    DOB: 17-Nov-1955,  MRN: [id number]  TANUJ MULLENS presents to clinic today for painful porokeratotic lesion(s) bilaterally and painful mycotic toenails that limit ambulation. Painful toenails interfere with ambulation. Aggravating factors include wearing enclosed shoe gear. Pain is relieved with periodic professional debridement. Painful porokeratotic lesions are aggravated when weightbearing with and without shoegear. Pain is relieved with periodic professional debridement.  Patient states he received his orthotics and his feet feel a little better.  PCP is Eulogio Bear, NP , and last visit was 01/16/2021.  No Known Allergies  Review of Systems: Negative except as noted in the HPI. Objective:   Constitutional MICAEL BARB is a pleasant 65 y.o. African American male, WD, WN in NAD. AAO x 3.   Vascular CFT immediate b/l LE. Palpable DP/PT pulses b/l LE. Digital hair sparse b/l. Skin temperature gradient WNL b/l. No pain with calf compression b/l. No edema noted b/l. No cyanosis or clubbing noted b/l LE. No cyanosis or clubbing noted.  Neurologic Normal speech. Oriented to person, place, and time. Protective sensation intact 5/5 intact bilaterally with 10g monofilament b/l. Vibratory sensation intact b/l.  Dermatologic Pedal integument with normal turgor, texture and tone BLE. Toenails 1-5 b/l elongated, discolored, dystrophic, thickened, crumbly with subungual debris and tenderness to dorsal palpation. Hyperkeratotic lesion(s) submet head 2 left foot and submet head 5 left foot.  No erythema, no edema, no drainage, no fluctuance. Porokeratotic lesion(s) submet head 2 right foot and submet head 3 left foot. No erythema, no edema, no drainage, no fluctuance.  Orthopedic: Normal muscle strength 5/5 to all lower extremity muscle groups bilaterally. Plantarflexed metatarsal(s) b/l feet.   Radiographs: None Assessment:   1. Pain due to  onychomycosis of toenails of both feet   2. Porokeratosis   3. Callus   4. Pain in both feet    Plan:  Patient was evaluated and treated and all questions answered. Consent given for treatment as described below: -No new findings. No new orders. -Mycotic toenails 1-5 bilaterally were debrided in length and girth with sterile nail nippers and dremel without incident. -Callus(es) submet head 2 left foot and submet head 5 left foot pared utilizing sterile scalpel blade without complication or incident. Total number debrided =2. -Painful porokeratotic lesion(s) submet head 2 right foot and submet head 3 left foot pared and enucleated with sterile scalpel blade without incident. Total number of lesions debrided=2. -Patient/POA to call should there be question/concern in the interim.  Return in about 3 months (around 10/17/2021).  Marzetta Board, DPM

## 2021-09-14 ENCOUNTER — Ambulatory Visit: Payer: 59 | Admitting: Nurse Practitioner

## 2021-09-30 ENCOUNTER — Ambulatory Visit (INDEPENDENT_AMBULATORY_CARE_PROVIDER_SITE_OTHER): Payer: 59 | Admitting: Nurse Practitioner

## 2021-09-30 ENCOUNTER — Encounter: Payer: Self-pay | Admitting: Nurse Practitioner

## 2021-09-30 ENCOUNTER — Other Ambulatory Visit: Payer: Self-pay

## 2021-09-30 VITALS — BP 168/100 | HR 84 | Ht 66.0 in | Wt 191.0 lb

## 2021-09-30 DIAGNOSIS — I10 Essential (primary) hypertension: Secondary | ICD-10-CM

## 2021-09-30 DIAGNOSIS — R600 Localized edema: Secondary | ICD-10-CM

## 2021-09-30 LAB — BASIC METABOLIC PANEL WITH GFR
BUN: 21 mg/dL (ref 7–25)
CO2: 32 mmol/L (ref 20–32)
Calcium: 9.5 mg/dL (ref 8.6–10.3)
Chloride: 107 mmol/L (ref 98–110)
Creat: 1.12 mg/dL (ref 0.70–1.35)
Glucose, Bld: 86 mg/dL (ref 65–99)
Potassium: 3.9 mmol/L (ref 3.5–5.3)
Sodium: 143 mmol/L (ref 135–146)
eGFR: 73 mL/min/{1.73_m2} (ref >=60)

## 2021-09-30 MED ORDER — VALSARTAN-HYDROCHLOROTHIAZIDE 80-12.5 MG PO TABS: 1.0000 | ORAL_TABLET | Freq: Every day | ORAL | 3 refills | Status: DC

## 2021-09-30 NOTE — Assessment & Plan Note (Signed)
Chronic, uncontrolled.  We discussed the importance of taking blood pressure medication daily as his blood pressure is significantly elevated in the office today.  Patient is in agreement to restart medications and understands the risks of ongoing untreated high blood pressure could be life threatening.  Start valsartan-HCTZ 80-12.5 mg daily.  Discussed DASH diet.  Check baseline kidney function and electrolytes, urine microalbumin today.  Patient was given a BP log and instructed to notify us with BP readings in 2 weeks.  I encouraged patient to establish care with a new PCP ASAP within the next month to monitor blood pressure and titrate medication as indicated.

## 2021-09-30 NOTE — Progress Notes (Signed)
Subjective:    Patient ID: William Barajas, male    DOB: 1956/03/17, 66 y.o.   MRN: [id number]  HPI: William Barajas is a 66 y.o. male presenting for blood pressure follow up.  Chief Complaint  Patient presents with   Hypertension   HYPERTENSION Has not been taking BP medication.  Was taking HCTZ 25 mg.  Reports he "does not like to take drugs." Hypertension status: uncontrolled  BP monitoring frequency:  not checking Medication compliance: poor compliance Aspirin: no Recurrent headaches: no Visual changes: no Palpitations: no Dyspnea: no Chest pain: no Lower extremity edema: no Dizzy/lightheaded: no  He has been following with Podiatrist for thick toe nails and calloses on feet.   No Known Allergies  Outpatient Encounter Medications as of 09/30/2021  Medication Sig   ciclopirox (LOPROX) 0.77 % cream Apply to both feet  and between toes twice daily.   valsartan-hydrochlorothiazide (DIOVAN-HCT) 80-12.5 MG tablet Take 1 tablet by mouth daily.   [DISCONTINUED] hydrochlorothiazide (HYDRODIURIL) 25 MG tablet Take 1 tablet (25 mg total) by mouth daily.   No facility-administered encounter medications on file as of 09/30/2021.    Patient Active Problem List   Diagnosis Date Noted   Essential hypertension 01/16/2021   BMI 30.0-30.9,adult 01/16/2021   Callus of foot 01/16/2021    Past Medical History:  Diagnosis Date   Bursitis, shoulder 10/30/2012   Carpal tunnel syndrome on left 11/08/2012   Hypertension    Muscle weakness (generalized) 12/01/2012   Pain in joint, shoulder region 12/01/2012   Rotator cuff syndrome of left shoulder 12/01/2012    Relevant past medical, surgical, family and social history reviewed and updated as indicated. Interim medical history since our last visit reviewed.  Review of Systems Per HPI unless specifically indicated above     Objective:    BP (!) 168/100    Pulse 84    Ht 5\' 6"  (1.676 m)    Wt 191 lb (86.6 kg)    SpO2 97%    BMI  30.83 kg/m   Wt Readings from Last 3 Encounters:  09/30/21 191 lb (86.6 kg)  03/13/21 178 lb (80.7 kg)  03/04/21 187 lb 12.8 oz (85.2 kg)    Physical Exam Vitals and nursing note reviewed.  Constitutional:      General: He is not in acute distress.    Appearance: Normal appearance. He is not toxic-appearing.  Neck:     Vascular: No carotid bruit.  Cardiovascular:     Rate and Rhythm: Normal rate and regular rhythm.     Heart sounds: Normal heart sounds. No murmur heard. Pulmonary:     Effort: Pulmonary effort is normal. No respiratory distress.     Breath sounds: Normal breath sounds. No wheezing, rhonchi or rales.  Musculoskeletal:     Cervical back: Normal range of motion.     Right lower leg: 2+ Edema present.     Left lower leg: 2+ Edema present.  Skin:    General: Skin is warm and dry.     Capillary Refill: Capillary refill takes less than 2 seconds.     Coloration: Skin is not jaundiced or pale.     Findings: No erythema.  Neurological:     Mental Status: He is alert and oriented to person, place, and time.       Assessment & Plan:   Problem List Items Addressed This Visit       Cardiovascular and Mediastinum   Essential hypertension - Primary  Chronic, uncontrolled.  We discussed the importance of taking blood pressure medication daily as his blood pressure is significantly elevated in the office today.  Patient is in agreement to restart medications and understands the risks of ongoing untreated high blood pressure could be life threatening.  Start valsartan-HCTZ 80-12.5 mg daily.  Discussed DASH diet.  Check baseline kidney function and electrolytes, urine microalbumin today.  Patient was given a BP log and instructed to notify us with BP readings in 2 weeks.  I encouraged patient to establish care with a new PCP ASAP within the next month to monitor blood pressure and titrate medication as indicated.       Relevant Medications   valsartan-hydrochlorothiazide  (DIOVAN-HCT) 80-12.5 MG tablet   Other Relevant Orders   BASIC METABOLIC PANEL WITH GFR   Microalbumin, urine   Other Visit Diagnoses     Lower extremity edema       Chronic.  Discussed compression, limiting salt intake in diet, elevated.  Combination blood pressure medication to be started should also help.        Follow up plan: Return for with new PCP in 1 month.

## 2021-10-01 ENCOUNTER — Telehealth: Payer: Self-pay

## 2021-10-01 LAB — MICROALBUMIN, URINE: Microalb, Ur: 4.3 mg/dL

## 2021-10-01 NOTE — Telephone Encounter (Signed)
-----   Message from Eulogio Bear, NP sent at 10/01/2021  3:45 PM EST ----- Please notify with lab results.  Electrolytes and kidney function are stable.  Urine protein level is elevated, this is likely related to his blood pressure being out of control.  Please encourage starting back on BP medication as we discussed yesterday and close follow up with new PCP.

## 2021-10-01 NOTE — Telephone Encounter (Signed)
Attempted to reach patient but no answer and unable to leave message.  Left message with patients daughter to have him call back regarding results and recommendations.

## 2021-10-22 ENCOUNTER — Other Ambulatory Visit: Payer: Self-pay

## 2021-10-22 ENCOUNTER — Emergency Department (HOSPITAL_COMMUNITY)
Admission: EM | Admit: 2021-10-22 | Discharge: 2021-10-22 | Disposition: A | Discharge status: Home / Self Care | Payer: 59 | Attending: Student | Admitting: Student

## 2021-10-22 ENCOUNTER — Encounter (HOSPITAL_COMMUNITY): Payer: Self-pay | Admitting: *Deleted

## 2021-10-22 DIAGNOSIS — Y92091 Bathroom in other non-institutional residence as the place of occurrence of the external cause: Secondary | ICD-10-CM | POA: Diagnosis not present

## 2021-10-22 DIAGNOSIS — W268XXA Contact with other sharp object(s), not elsewhere classified, initial encounter: Secondary | ICD-10-CM | POA: Insufficient documentation

## 2021-10-22 DIAGNOSIS — S61210A Laceration without foreign body of right index finger without damage to nail, initial encounter: Secondary | ICD-10-CM | POA: Diagnosis not present

## 2021-10-22 DIAGNOSIS — S61209A Unspecified open wound of unspecified finger without damage to nail, initial encounter: Secondary | ICD-10-CM

## 2021-10-22 DIAGNOSIS — Z79899 Other long term (current) drug therapy: Secondary | ICD-10-CM | POA: Insufficient documentation

## 2021-10-22 DIAGNOSIS — I10 Essential (primary) hypertension: Secondary | ICD-10-CM | POA: Diagnosis not present

## 2021-10-22 DIAGNOSIS — S6991XA Unspecified injury of right wrist, hand and finger(s), initial encounter: Secondary | ICD-10-CM | POA: Diagnosis present

## 2021-10-22 MED ORDER — LIDOCAINE HCL (PF) 1 % IJ SOLN
5.0000 mL | Freq: Once | INTRAMUSCULAR | Status: AC
  Administered 2021-10-22: 5 mL
  Filled 2021-10-22: qty 30

## 2021-10-22 NOTE — ED Provider Notes (Signed)
[facility] Provider Note   CSN: [id number] Arrival date & time: 10/22/21  1140     History  Chief Complaint  Patient presents with   Laceration    William Barajas is a 66 y.o. male.  William Barajas is a 66 y.o. male with hx of HTN, who presents for evaluation of finger laceration. 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 Finger with continued bleeding. Reports he can move the finger and has normal sensation.  Patient is not on any blood thinners. Tetanus reported up to date.  The history is provided by the patient.      Home Medications Prior to Admission medications   Medication Sig Start Date End Date Taking? Authorizing Provider  ciclopirox (LOPROX) 0.77 % cream Apply to both feet  and between toes twice daily. 05/08/21   Marzetta Board, DPM  valsartan-hydrochlorothiazide (DIOVAN-HCT) 80-12.5 MG tablet Take 1 tablet by mouth daily. 09/30/21   Eulogio Bear, NP      Allergies    Patient has no known allergies.    Review of Systems   Review of Systems  Constitutional:  Negative for chills and fever.  Skin:  Positive for wound.  Neurological:  Negative for weakness and numbness.   Physical Exam Updated Vital Signs BP (!) 182/101    Pulse 80    Temp 98.1 F (36.7 C) (Oral)    Resp 20    Ht 5\' 6"  (1.676 m)    Wt 81.6 kg    SpO2 98%    BMI 29.05 kg/m  Physical Exam Vitals and nursing note reviewed.  Constitutional:      General: He is not in acute distress.    Appearance: Normal appearance. He is well-developed. He is not ill-appearing or diaphoretic.  HENT:     Head: Normocephalic and atraumatic.  Eyes:     General:        Right eye: No discharge.        Left eye: No discharge.  Pulmonary:     Effort: Pulmonary effort is normal. No respiratory distress.  Musculoskeletal:     Comments: Right index team there with 1 x 1 cm area of skin avulsed away  from the fingertip with continual oozing of blood.  This is superficial and there is no bone visible, no damage to the nail or nailbed.  Normal range of motion of the finger, normal sensation and cap refill.  Skin:    General: Skin is warm and dry.  Neurological:     Mental Status: He is alert and oriented to person, place, and time.     Coordination: Coordination normal.  Psychiatric:        Mood and Affect: Mood normal.        Behavior: Behavior normal.    ED Results / Procedures / Treatments   Labs (all labs ordered are listed, but only abnormal results are displayed) Labs Reviewed - No data to display  EKG None  Radiology No results found.  Procedures .Nerve Block  Date/Time: 10/22/2021 3:33 PM Performed by: Jacqlyn Larsen, PA-C Authorized by: Jacqlyn Larsen, PA-C   Consent:    Consent obtained:  Verbal   Consent given by:  Patient   Risks, benefits, and alternatives were discussed: yes     Risks discussed:  Allergic reaction, infection, nerve damage, swelling, bleeding, intravenous injection,  pain and unsuccessful block   Alternatives discussed:  No treatment Universal protocol:    Procedure explained and questions answered to patient or proxy's satisfaction: yes     Patient identity confirmed:  Verbally with patient Indications:    Indications:  Pain relief Location:    Body area:  Upper extremity   Upper extremity nerve blocked: right index finger. Pre-procedure details:    Skin preparation:  Alcohol Skin anesthesia:    Skin anesthesia method:  None Procedure details:    Block needle gauge:  25 G   Anesthetic injected:  Lidocaine 1% w/o epi   Steroid injected:  None   Additive injected:  None   Injection procedure:  Anatomic landmarks identified, incremental injection, negative aspiration for blood, anatomic landmarks palpated and introduced needle   Paresthesia:  None Post-procedure details:    Outcome:  Anesthesia achieved   Procedure completion:   Tolerated well, no immediate complications    Medications Ordered in ED Medications  lidocaine (PF) (XYLOCAINE) 1 % injection 5 mL (5 mLs Infiltration Given 10/22/21 1254)    ED Course/ Medical Decision Making/ A&P                           66 year old male presents with right index finger laceration while working here in the hospital.  A 1 x 1 cm area of skin was lacerated away from the fingertip.  Tissue was avulsed away and not amenable to suture repair, but is continually oozing blood.  Digital block achieved for pain relief and then I have pressure dressing was applied and the patient held pressure on the fingertip after which she did not bleed through the dressing and achieved hemostasis.  We will have patient remain in this dressing for 48 hours and then change dressing daily.  Discussed infection return precautions.  Given superficial nature of laceration do not feel that imaging is indicated or would change management.  Tetanus is reportedly up-to-date.  Discussed appropriate discharge instructions and wound care with patient.  He expresses understanding and agreement.  Discharged home in good condition.        Final Clinical Impression(s) / ED Diagnoses Final diagnoses:  Avulsion of finger tip, initial encounter    Rx / DC Orders ED Discharge Orders     None         Janet Berlin 10/22/21 1538    Teressa Lower, MD 10/22/21 1651

## 2021-10-22 NOTE — ED Triage Notes (Signed)
Laceration to right hand. Bleeding controlled

## 2021-10-22 NOTE — Discharge Instructions (Signed)
Keep dressing in place for the next two days and keep finger dry, after this you can change dressing twice daily.  If you have bleeding apply pressure and hold constantly for 10 to 15 minutes and then if bleeding persists come back in for reevaluation.  Monitor for signs of infection such as redness, swelling, increasing pain or drainage, if this occurs return for recheck.

## 2021-10-26 ENCOUNTER — Ambulatory Visit: Payer: 59 | Admitting: Podiatry

## 2021-10-30 ENCOUNTER — Ambulatory Visit: Payer: 59 | Admitting: Podiatry

## 2021-11-06 ENCOUNTER — Ambulatory Visit: Payer: 59 | Admitting: Podiatry

## 2021-11-06 ENCOUNTER — Encounter: Payer: Self-pay | Admitting: Podiatry

## 2021-11-06 ENCOUNTER — Other Ambulatory Visit: Payer: Self-pay

## 2021-11-06 DIAGNOSIS — M79675 Pain in left toe(s): Secondary | ICD-10-CM

## 2021-11-06 DIAGNOSIS — L84 Corns and callosities: Secondary | ICD-10-CM

## 2021-11-06 DIAGNOSIS — B351 Tinea unguium: Secondary | ICD-10-CM

## 2021-11-06 DIAGNOSIS — M79674 Pain in right toe(s): Secondary | ICD-10-CM | POA: Diagnosis not present

## 2021-11-06 DIAGNOSIS — M79672 Pain in left foot: Secondary | ICD-10-CM

## 2021-11-06 DIAGNOSIS — M79671 Pain in right foot: Secondary | ICD-10-CM

## 2021-11-15 NOTE — Progress Notes (Signed)
?  Subjective:  ?Patient ID: William Barajas, male    DOB: 1956/02/08,  MRN: [id number] ? ?William Barajas presents to clinic today for callus(es) b/l lower extremities and painful thick toenails that are difficult to trim. Painful toenails interfere with ambulation. Aggravating factors include wearing enclosed shoe gear. Pain is relieved with periodic professional debridement. Painful calluses are aggravated when weightbearing with and without shoegear. Pain is relieved with periodic professional debridement. ? ?New problem(s): None.  ? ?PCP is William Bear, NP , and last visit was September 30, 2021. ? ?No Known Allergies ? ?Review of Systems: Negative except as noted in the HPI. ? ?Objective: ? ?Vascular Examination: ?Vascular status intact b/l with palpable pedal pulses. CFT immediate b/l. No edema. No pain with calf compression b/l. Skin temperature gradient WNL b/l.  ? ?Neurological Examination: ?Sensation grossly intact b/l with 10 gram monofilament. Vibratory sensation intact b/l.  ? ?Dermatological Examination: ?Pedal skin with normal turgor, texture and tone b/l. Toenails 1-5 b/l thick, discolored, elongated with subungual debris and pain on dorsal palpation. Porokeratotic lesion(s) resolved bilaterally. Lister's corn lateral aspect right 4th digit nailplate. ? ?Musculoskeletal Examination: ?Muscle strength 5/5 to b/l LE. Plantarflexed metatarsals b/l feet. ? ?Assessment/Plan: ?1. Pain due to onychomycosis of toenails of both feet   ?2. Corns   ?3. Pain in both feet   ?-Examined patient. ?-Toenails 1-5 b/l were debrided in length and girth with sterile nail nippers and dremel without iatrogenic bleeding.  ?-Corn(s) R 4th toe pared utilizing sterile scalpel blade without complication or incident. Total number debrided=1. ?-Patient/POA to call should there be question/concern in the interim.  ? ?Return in about 3 months (around 02/06/2022). ? ?Marzetta Board, DPM  ?

## 2022-01-14 ENCOUNTER — Ambulatory Visit (INDEPENDENT_AMBULATORY_CARE_PROVIDER_SITE_OTHER): Payer: 59 | Admitting: Nurse Practitioner

## 2022-01-14 ENCOUNTER — Encounter: Payer: Self-pay | Admitting: Nurse Practitioner

## 2022-01-14 VITALS — BP 162/98 | HR 75 | Ht 66.0 in | Wt 187.0 lb

## 2022-01-14 DIAGNOSIS — I10 Essential (primary) hypertension: Secondary | ICD-10-CM

## 2022-01-14 DIAGNOSIS — L84 Corns and callosities: Secondary | ICD-10-CM | POA: Diagnosis not present

## 2022-01-14 MED ORDER — UNABLE TO FIND: 1.0000 | Freq: Every day | 0 refills | Status: DC

## 2022-01-14 MED ORDER — VALSARTAN-HYDROCHLOROTHIAZIDE 80-12.5 MG PO TABS: 1.0000 | ORAL_TABLET | Freq: Every day | ORAL | 3 refills | Status: DC

## 2022-01-14 NOTE — Assessment & Plan Note (Signed)
Has had debridement done by podiatry in the past ?Denies pain today ?Patient encouraged to maintain close follow-up with podiatry verbalized understanding ?

## 2022-01-14 NOTE — Assessment & Plan Note (Signed)
BP Readings from Last 3 Encounters:  ?01/14/22 (!) 162/98  ?10/22/21 (!) 158/101  ?09/30/21 (!) 168/100  ?Chronic condition uncontrolled, on valsartan -hydrochlorothiazide 80-12.5 mg tablet ordered patient reports not taking medication ?Need to take medication as prescribed discussed with patient today, patient encouraged to start taking medication daily, check his blood pressure daily write down the numbers and bring to follow-up in 4 weeks ?DASH diet advised patient encouraged to engage in daily exercises at least for 50 minutes weekly ?Check BMP in 2 weeks ?

## 2022-01-14 NOTE — Patient Instructions (Addendum)
Please get your shingles vaccine and TDAP vaccine at your pharmacy.  ? ?Please start taking your valsartan- hydrochlorothiazide 80-12.'5mg'$  daily. Please monitor your blood pressure daily write down the number and bring to your next appointment.  ? ? ? ? ? ?It is important that you exercise regularly at least 30 minutes 5 times a week.  ?Think about what you will eat, plan ahead. ?Choose " clean, green, fresh or frozen" over canned, processed or packaged foods which are more sugary, salty and fatty. ?70 to 75% of food eaten should be vegetables and fruit. ?Three meals at set times with snacks allowed between meals, but they must be fruit or vegetables. ?Aim to eat over a 12 hour period , example 7 am to 7 pm, and STOP after  your last meal of the day. ?Drink water,generally about 64 ounces per day, no other drink is as healthy. Fruit juice is best enjoyed in a healthy way, by EATING the fruit. ? ?Thanks for choosing [facility], we consider it a privelige to serve you. ? ?

## 2022-01-14 NOTE — Progress Notes (Signed)
? ?New Patient Office Visit ? ?Subjective   ? ?Patient ID: William Barajas, male    DOB: [birth year]  Age: 66 y.o. MRN: [id number] ? ?CC:  ?Chief Complaint  ?Patient presents with  ? New Patient (Initial Visit)  ?  Establish care bp high feet issues.  ? ? ?HPI ?William Barajas with past medical history of hypertension, obesity, current loss of fluids presents to establish care for his chronic medical conditions previous patient of William Chapel, NP ? ?Hypertension.  Chronic condition has valsartan hydrochlorothiazide 80-12.5 mg tablet ordered patient reports not taking medication does not have any reason for not taking medication, denies chest pain, edema, dizziness, syncope. ? ?Has callus on his right foot he has been seeing a podiatrist 4 days patient denies pain, no concerns with his foot today. ? ?Patient is due for tetanus vaccine, pneumonia vaccine, shingles vaccine need for all vaccines discussed with patient patient encouraged to get follow-up vaccines at his pharmacy. ? ? ?Outpatient Encounter Medications as of 01/14/2022  ?Medication Sig  ? ciclopirox (LOPROX) 0.77 % cream Apply to both feet  and between toes twice daily.  ? UNABLE TO FIND 1 each by Other route daily. Med Name: Blood pressure cuff dx: I10  ? valsartan-hydrochlorothiazide (DIOVAN-HCT) 80-12.5 MG tablet Take 1 tablet by mouth daily.  ? [DISCONTINUED] valsartan-hydrochlorothiazide (DIOVAN-HCT) 80-12.5 MG tablet Take 1 tablet by mouth daily. (Patient not taking: Reported on 01/14/2022)  ? ?No facility-administered encounter medications on file as of 01/14/2022.  ? ? ?Past Medical History:  ?Diagnosis Date  ? Bursitis, shoulder 10/30/2012  ? Carpal tunnel syndrome on left 11/08/2012  ? Hypertension   ? Muscle weakness (generalized) 12/01/2012  ? Pain in joint, shoulder region 12/01/2012  ? Rotator cuff syndrome of left shoulder 12/01/2012  ? ? ?Past Surgical History:  ?Procedure Laterality Date  ? COLONOSCOPY WITH PROPOFOL N/A 03/13/2021  ? Procedure:  COLONOSCOPY WITH PROPOFOL;  Surgeon: Eloise Harman, DO;  Location: [facility];  Service: Endoscopy;  Laterality: N/A;  ASA II /12:30  ? KNEE SURGERY Right   ? POLYPECTOMY  03/13/2021  ? Procedure: POLYPECTOMY;  Surgeon: Eloise Harman, DO;  Location: [facility];  Service: Endoscopy;;  ? ? ?Family History  ?Problem Relation Age of Onset  ? Arthritis Mother   ? Bone cancer Mother   ? Cancer - Lung Father   ? Breast cancer Sister   ? Hypertension Sister   ? Hyperlipidemia Sister   ? Cancer Other   ? Asthma Other   ? Diabetes Other   ? Cancer - Prostate Neg Hx   ? Colon cancer Neg Hx   ? ? ?Social History  ? ?Socioeconomic History  ? Marital status: Single  ?  Spouse name: Not on file  ? Number of children: 1  ? Years of education: Not on file  ? Highest education level: Not on file  ?Occupational History  ? Not on file  ?Tobacco Use  ? Smoking status: Never  ? Smokeless tobacco: Never  ?Substance and Sexual Activity  ? Alcohol use: No  ? Drug use: No  ? Sexual activity: Not on file  ?Other Topics Concern  ? Not on file  ?Social History Narrative  ? Lives with his sister, works at [employer].   ? ?Social Determinants of Health  ? ?Financial Resource Strain: Not on file  ?Food Insecurity: Not on file  ?Transportation Needs: Not on file  ?Physical Activity: Not on file  ?Stress:  Not on file  ?Social Connections: Not on file  ?Intimate Partner Violence: Not on file  ? ? ?ROS ? ?  ? ? ?Objective   ? ?BP (!) 162/98   Pulse 75   Ht '5\' 6"'$  (1.676 m)   Wt 187 lb (84.8 kg)   SpO2 98%   BMI 30.18 kg/m?  ? ?Physical Exam ? ? ?  ? ?Assessment & Plan:  ? ?Problem List Items Addressed This Visit   ? ?  ? Cardiovascular and Mediastinum  ? Essential hypertension - Primary  ?  BP Readings from Last 3 Encounters:  ?01/14/22 (!) 162/98  ?10/22/21 (!) 158/101  ?09/30/21 (!) 168/100  ?Chronic condition uncontrolled, on valsartan -hydrochlorothiazide 80-12.5 mg tablet ordered patient reports not taking medication ?Need to take  medication as prescribed discussed with patient today, patient encouraged to start taking medication daily, check his blood pressure daily write down the numbers and bring to follow-up in 4 weeks ?DASH diet advised patient encouraged to engage in daily exercises at least for 50 minutes weekly ?Check BMP in 2 weeks ?  ?  ? Relevant Medications  ? valsartan-hydrochlorothiazide (DIOVAN-HCT) 80-12.5 MG tablet  ? Other Relevant Orders  ? Basic Metabolic Panel (BMET)  ?  ? Musculoskeletal and Integument  ? Callus of foot  ?  Has had debridement done by podiatry in the past ?Denies pain today ?Patient encouraged to maintain close follow-up with podiatry verbalized understanding ? ?  ?  ? ?Other Visit Diagnoses   ? ? Impaired fasting glucose      ? ?  ? ? ?Return in about 4 weeks (around 02/11/2022) for HTN.  ? ?Renee Rival, FNP ? ? ?

## 2022-01-15 IMAGING — DX DG HIP (WITH OR WITHOUT PELVIS) 2-3V*R*
3 series · 3 of 3 positions shown · non-contrast
Comparison: 02/21/2011

CLINICAL DATA: Right hip pain since yesterday

EXAM:
DG HIP (WITH OR WITHOUT PELVIS) 2-3V RIGHT

[hip ap]
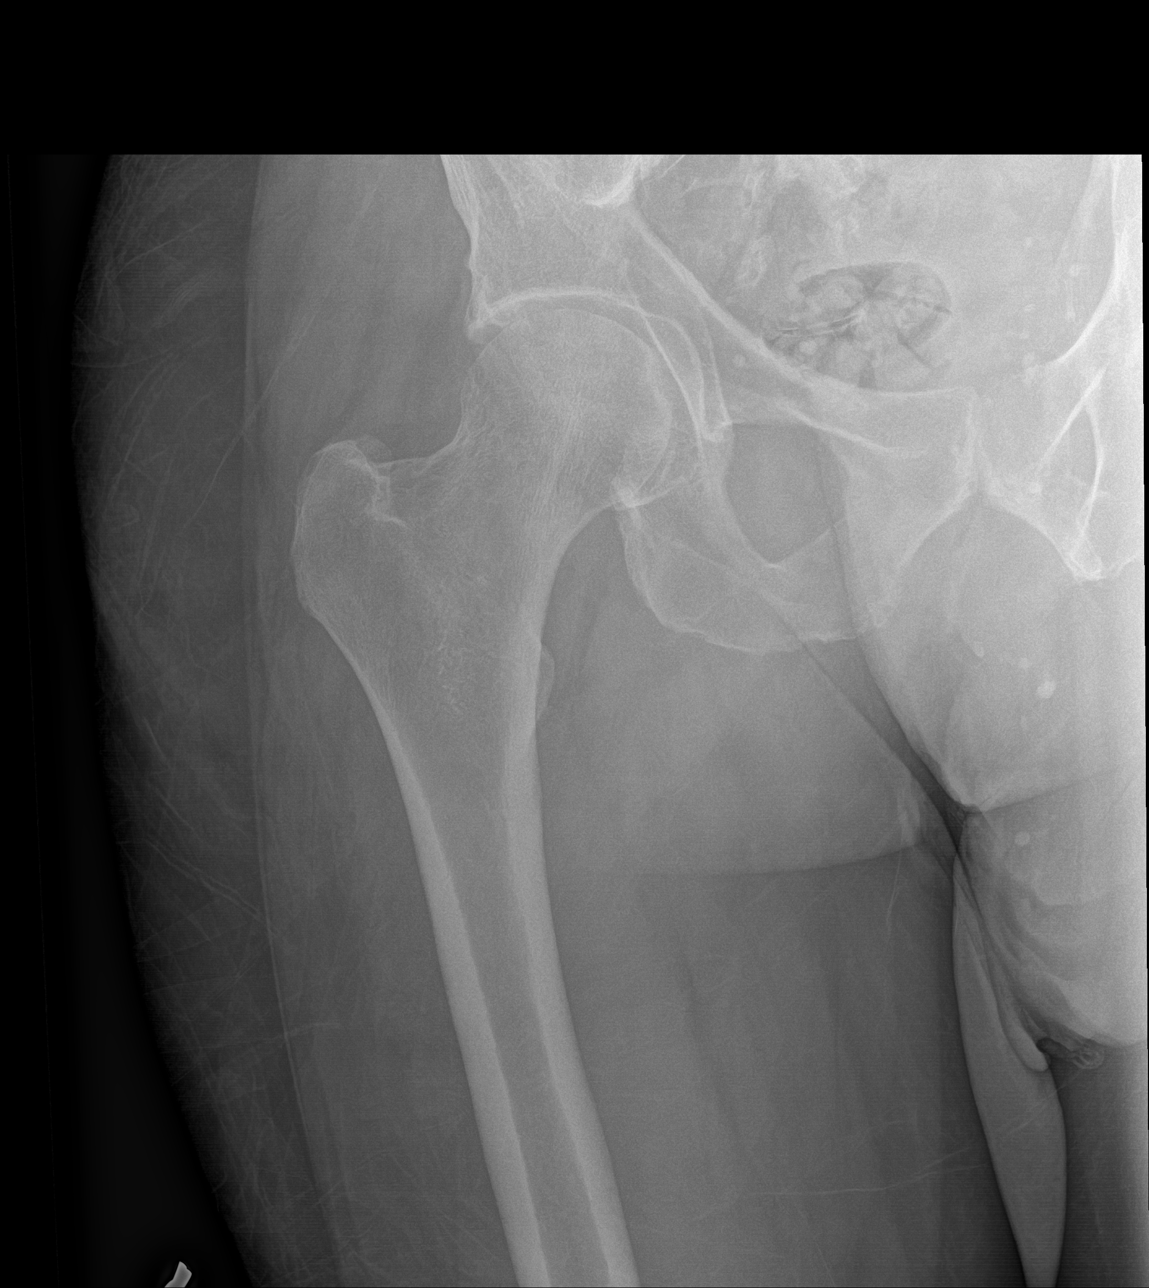

[pelvis ap grid]
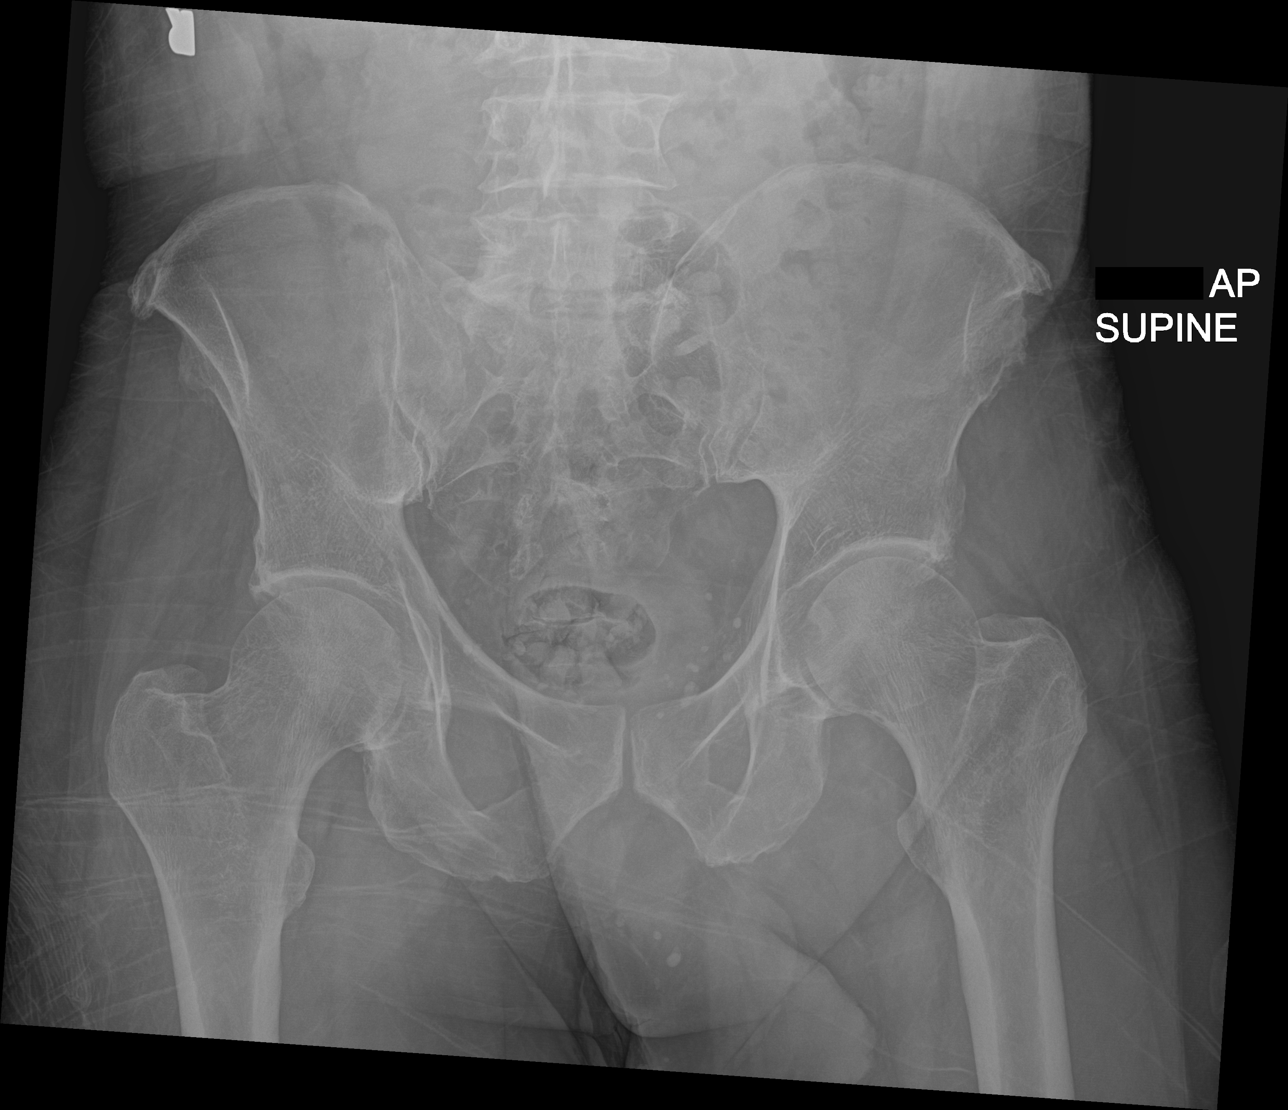

[hip lat]
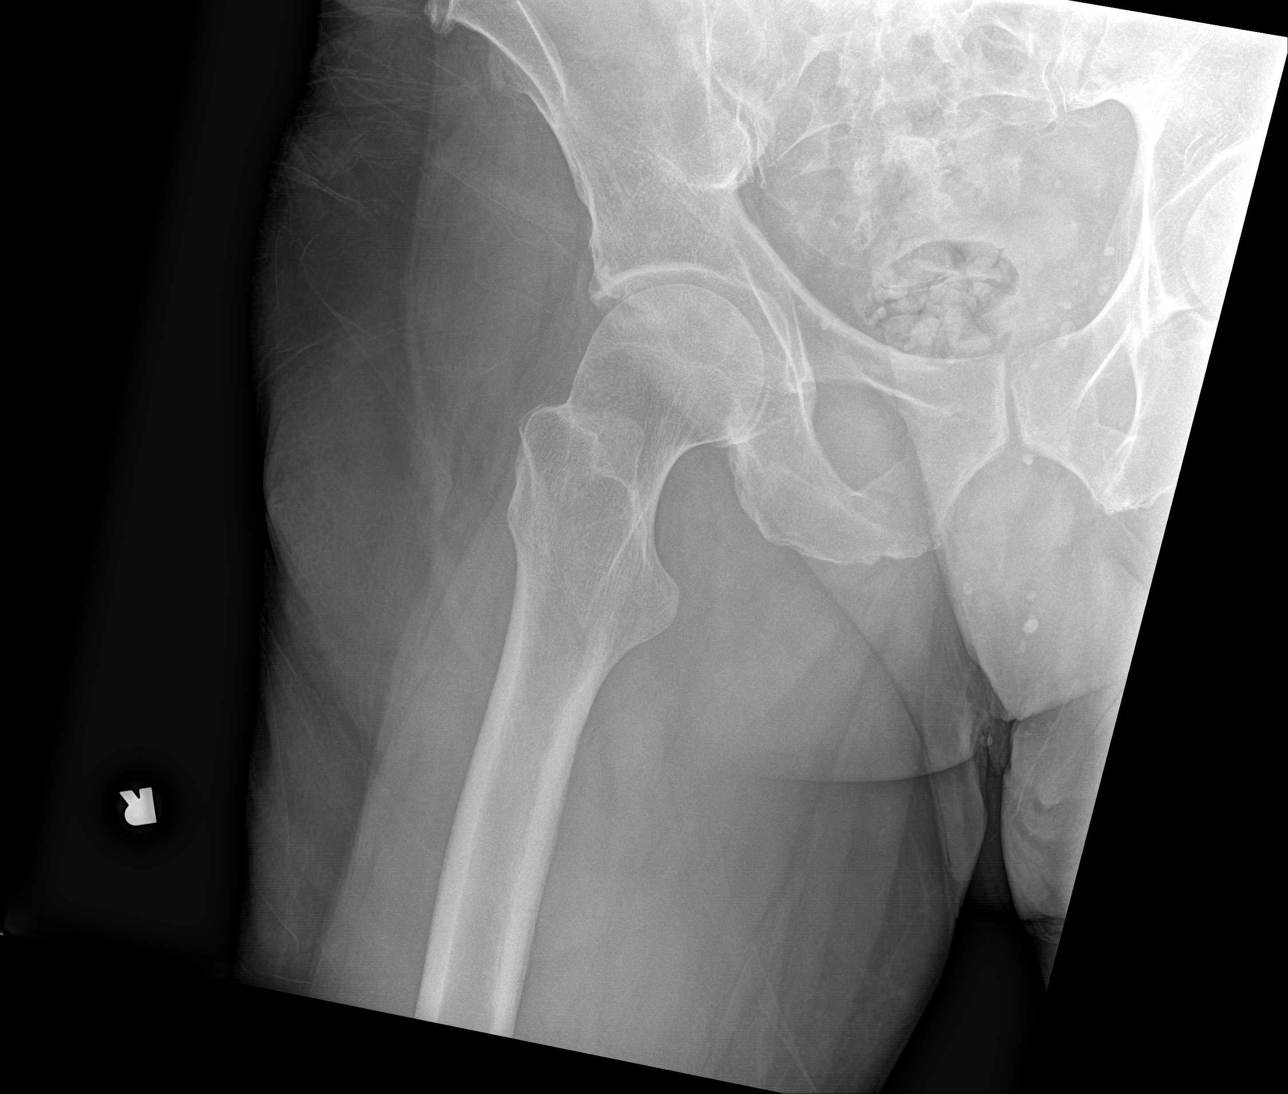

[3 of 3 positions shown; findings below may reference images not displayed]

FINDINGS: There is no evidence of hip fracture or dislocation. There is no
evidence of arthropathy or other focal bone abnormality.
IMPRESSION: Negative.

## 2022-01-20 ENCOUNTER — Telehealth: Payer: Self-pay | Admitting: Nurse Practitioner

## 2022-01-20 NOTE — Telephone Encounter (Signed)
Patient needs meds sent in to [facility] , [address] [place] for insurance to cover  ? ? ? ?valsartan-hydrochlorothiazide (DIOVAN-HCT) 80-12.5 MG tablet  ? ?

## 2022-01-21 ENCOUNTER — Other Ambulatory Visit: Payer: Self-pay

## 2022-01-21 ENCOUNTER — Other Ambulatory Visit (HOSPITAL_COMMUNITY): Payer: Self-pay

## 2022-01-21 DIAGNOSIS — I10 Essential (primary) hypertension: Secondary | ICD-10-CM

## 2022-01-21 MED ORDER — VALSARTAN-HYDROCHLOROTHIAZIDE 80-12.5 MG PO TABS
1.0000 | ORAL_TABLET | Freq: Every day | ORAL | 3 refills | Status: DC
  Filled 2022-01-21 – 2022-01-29 (×2): qty 90, 90d supply, fill #0

## 2022-01-21 NOTE — Telephone Encounter (Signed)
Called number it is his niece's number annie she will have pt reach out to [place] but she is having a hard time reaching him advised to check with pharmacy

## 2022-01-21 NOTE — Telephone Encounter (Signed)
Called pt at number provided it was an office number of a lady. Will send rx to [facility] but patient will need to contact pharmacy to have them send it to [facility].

## 2022-01-25 ENCOUNTER — Other Ambulatory Visit: Payer: Self-pay

## 2022-01-25 ENCOUNTER — Other Ambulatory Visit (HOSPITAL_COMMUNITY): Payer: Self-pay

## 2022-01-29 ENCOUNTER — Other Ambulatory Visit (HOSPITAL_COMMUNITY): Payer: Self-pay

## 2022-02-03 DIAGNOSIS — I10 Essential (primary) hypertension: Secondary | ICD-10-CM | POA: Diagnosis not present

## 2022-02-04 LAB — BASIC METABOLIC PANEL
BUN/Creatinine Ratio: 15 (ref 10–24)
BUN: 16 mg/dL (ref 8–27)
CO2: 23 mmol/L (ref 20–29)
Calcium: 9.1 mg/dL (ref 8.6–10.2)
Chloride: 105 mmol/L (ref 96–106)
Creatinine, Ser: 1.05 mg/dL (ref 0.76–1.27)
Glucose: 95 mg/dL (ref 70–99)
Potassium: 4 mmol/L (ref 3.5–5.2)
Sodium: 142 mmol/L (ref 134–144)
eGFR: 79 mL/min/{1.73_m2} (ref >59)

## 2022-02-04 NOTE — Progress Notes (Signed)
Normal labs were discussed with patient at upcoming appointment

## 2022-02-09 ENCOUNTER — Ambulatory Visit: Payer: Medicare Other | Admitting: Nurse Practitioner

## 2022-02-09 ENCOUNTER — Ambulatory Visit: Payer: 59 | Admitting: Podiatry

## 2022-02-11 ENCOUNTER — Encounter: Payer: Self-pay | Admitting: Nurse Practitioner

## 2022-02-11 ENCOUNTER — Ambulatory Visit (INDEPENDENT_AMBULATORY_CARE_PROVIDER_SITE_OTHER): Payer: 59 | Admitting: Nurse Practitioner

## 2022-02-11 VITALS — BP 160/98 | HR 88 | Ht 66.0 in | Wt 190.0 lb

## 2022-02-11 DIAGNOSIS — Z23 Encounter for immunization: Secondary | ICD-10-CM

## 2022-02-11 DIAGNOSIS — I10 Essential (primary) hypertension: Secondary | ICD-10-CM

## 2022-02-11 DIAGNOSIS — Z6830 Body mass index (BMI) 30.0-30.9, adult: Secondary | ICD-10-CM | POA: Diagnosis not present

## 2022-02-11 NOTE — Progress Notes (Signed)
   William Barajas     MRN: [id number]      DOB: 1955-09-22   HPI William Barajas with past medical history of essential hypertension, obesity is here for follow up for hypertension .patient stated that he has not been checking his BP at home, not taking his BP meds daily.  Patient did not give any reason for not taking his blood pressure medication.  Patient is accompanied to today's visit by his niece who confirmed that patient has not been compliant with taking his blood pressure medication.  Patient denies chest pain, edema, dizziness   Due for Tdap vaccine, shingles vaccines, pneumonia vaccine need for all vaccines discussed with patient.  Patient goal is Tdap vaccine today     ROS Denies recent fever or chills. Denies sinus pressure, nasal congestion, ear pain or sore throat. Denies chest congestion, productive cough or wheezing. Denies chest pains, palpitations and leg swelling Denies abdominal pain, nausea, vomiting,diarrhea or constipation.   Denies dysuria, frequency, hesitancy or incontinence. Denies joint pain, swelling and limitation in mobility. Denies headaches, seizures, numbness, or tingling. Denies depression, anxiety or insomnia. Denies skin break down or rash.   PE  BP (!) 160/98   Pulse 88   Ht '5\' 6"'$  (1.676 m)   Wt 190 lb (86.2 kg)   SpO2 95%   BMI 30.67 kg/m   Patient alert and oriented and in no cardiopulmonary distress. Chest: Clear to auscultation bilaterally.  CVS: S1, S2 no murmurs, no S3.Regular rate.  ABD: Soft non tender.   Ext: No edema  MS: Adequate ROM spine, shoulders, hips and knees.  Skin: Intact, no ulcerations or rash noted.  Psych: Good eye contact, normal affect. Memory intact not anxious or depressed appearing.     Assessment & Plan  Essential hypertension BP Readings from Last 3 Encounters:  02/11/22 (!) 160/98  01/14/22 (!) 162/98  10/22/21 (!) 158/101  Chronic condition uncontrolled Currently on  valsartan-hydrochlorothiazide 80-12.5 mg 1 tablet daily.  Patient has not been taking his medication, this was confirmed by the patient's niece Need to take medication as ordered to help prevent diabetes to his kidney, heart attack, stroke discussed with patient at her niece they verbalized understanding. DASH diet advised engage in regular exercises at least 150 minutes weekly Continue to monitor blood pressure at home goal is BP less than 140/90 Follow-up in 3 months  BMI 30.0-30.9,adult Wt Readings from Last 3 Encounters:  02/11/22 190 lb (86.2 kg)  01/14/22 187 lb (84.8 kg)  10/22/21 180 lb (81.6 kg)  Current BMI 30.67 He does not exercise Need to increase intake of whole foods consisting mainly vegetables and protein less carbohydrate drinking at least 64 ounces of water daily, engaging in regular moderate exercises at least 150 minutes weekly as tolerated importance of portion control discussed.   Need for Tdap vaccination Patient educated on CDC recommendation for the Tdap vaccine. Verbal consent was obtained from the patient, vaccine administered by nurse, no sign of adverse reactions noted at this time. Patient education on arm soreness and use of tylenol or ibuprofen  for this patient  was discussed.

## 2022-02-11 NOTE — Assessment & Plan Note (Signed)
BP Readings from Last 3 Encounters:  02/11/22 (!) 160/98  01/14/22 (!) 162/98  10/22/21 (!) 158/101  Chronic condition uncontrolled Currently on valsartan-hydrochlorothiazide 80-12.5 mg 1 tablet daily.  Patient has not been taking his medication, this was confirmed by the patient's niece Need to take medication as ordered to help prevent diabetes to his kidney, heart attack, stroke discussed with patient at her niece they verbalized understanding. DASH diet advised engage in regular exercises at least 150 minutes weekly Continue to monitor blood pressure at home goal is BP less than 140/90 Follow-up in 3 months

## 2022-02-11 NOTE — Patient Instructions (Addendum)
It is important that you take your blood pressure medication as ordered daily to help prevent kidney damage, stroke, heart attack.  Please consider taking valsartan-hydrochlorothiazide 80-12.'5mg'$  ,1 tablet daily.  Please monitor your blood pressure daily at home, goal for blood pressure is less than 140/90.    It is important that you exercise regularly at least 30 minutes 5 times a week.  Think about what you will eat, plan ahead. Choose " clean, green, fresh or frozen" over canned, processed or packaged foods which are more sugary, salty and fatty. 70 to 75% of food eaten should be vegetables and fruit. Three meals at set times with snacks allowed between meals, but they must be fruit or vegetables. Aim to eat over a 12 hour period , example 7 am to 7 pm, and STOP after  your last meal of the day. Drink water,generally about 64 ounces per day, no other drink is as healthy. Fruit juice is best enjoyed in a healthy way, by EATING the fruit.  Thanks for choosing [facility], we consider it a privelige to serve you.

## 2022-02-11 NOTE — Assessment & Plan Note (Addendum)
Patient educated on CDC recommendation for the Tdap vaccine. Verbal consent was obtained from the patient, vaccine administered by nurse, no sign of adverse reactions noted at this time. Patient education on arm soreness and use of tylenol or ibuprofen  for this patient  was discussed.

## 2022-02-11 NOTE — Assessment & Plan Note (Signed)
Wt Readings from Last 3 Encounters:  02/11/22 190 lb (86.2 kg)  01/14/22 187 lb (84.8 kg)  10/22/21 180 lb (81.6 kg)  Current BMI 30.67 He does not exercise Need to increase intake of whole foods consisting mainly vegetables and protein less carbohydrate drinking at least 64 ounces of water daily, engaging in regular moderate exercises at least 150 minutes weekly as tolerated importance of portion control discussed.

## 2022-02-12 ENCOUNTER — Encounter: Payer: Self-pay | Admitting: Podiatry

## 2022-02-12 ENCOUNTER — Ambulatory Visit (INDEPENDENT_AMBULATORY_CARE_PROVIDER_SITE_OTHER): Payer: 59 | Admitting: Podiatry

## 2022-02-12 ENCOUNTER — Other Ambulatory Visit (HOSPITAL_COMMUNITY): Payer: Self-pay

## 2022-02-12 DIAGNOSIS — M79675 Pain in left toe(s): Secondary | ICD-10-CM

## 2022-02-12 DIAGNOSIS — B351 Tinea unguium: Secondary | ICD-10-CM

## 2022-02-12 DIAGNOSIS — B353 Tinea pedis: Secondary | ICD-10-CM | POA: Diagnosis not present

## 2022-02-12 DIAGNOSIS — M79674 Pain in right toe(s): Secondary | ICD-10-CM | POA: Diagnosis not present

## 2022-02-12 MED ORDER — CICLOPIROX OLAMINE 0.77 % EX CREA
1.0000 "application " | TOPICAL_CREAM | Freq: Two times a day (BID) | CUTANEOUS | 1 refills | Status: AC
  Filled 2022-02-12: qty 90, 30d supply, fill #0

## 2022-02-12 NOTE — Patient Instructions (Signed)
Athlete's Foot Athlete's foot (tinea pedis) is a fungal infection of the skin on your feet. It often occurs on the skin that is between or underneath the toes. It can also occur on the soles of your feet. The infection can spread from person to person (is contagious). It can also spread when a person's bare feet come in contact with the fungus on shower floors or on items such as shoes. What are the causes? This condition is caused by a fungus that grows in warm, moist places. You can get athlete's foot by sharing shoes, shower stalls, towels, and wet floors with someone who is infected. Not washing your feet or changing your socks often enough can also lead to athlete's foot. What increases the risk? This condition is more likely to develop in: Men. People who have a weak body defense system (immune system). People who have diabetes. People who use public showers, such as at a gym. People who wear heavy-duty shoes, such as industrial or military shoes. Seasons with warm, humid weather. What are the signs or symptoms? Symptoms of this condition include: Itchy areas between your toes or on the soles of your feet. White, flaky, or scaly areas between your toes or on the soles of your feet. Very itchy small blisters between your toes or on the soles of your feet. Small cuts in your skin. These cuts can become infected. Thick or discolored toenails. How is this diagnosed? This condition may be diagnosed with a physical exam and a review of your medical history. Your health care provider may also take a skin or toenail sample to examine under a microscope. How is this treated? This condition is treated with antifungal medicines. These may be applied as powders, ointments, or creams. In severe cases, an oral antifungal medicine may be given. Follow these instructions at home: Medicines Apply or take over-the-counter and prescription medicines only as told by your health care provider. Apply your  antifungal medicine as told by your health care provider. Do not stop using the antifungal even if your condition improves. Foot care Do not scratch your feet. Keep your feet dry: Wear cotton or wool socks. Change your socks every day or if they become wet. Wear shoes that allow air to flow, such as sandals or canvas tennis shoes. Wash and dry your feet, including the area between your toes. Also, wash and dry your feet: Every day or as told by your health care provider. After exercising. General instructions Do not let others use towels, shoes, nail clippers, or other personal items that touch your feet. Protect your feet by wearing sandals in wet areas, such as locker rooms and shared showers. Keep all follow-up visits. This is important. If you have diabetes, keep your blood sugar under control. Contact a health care provider if: You have a fever. You have swelling, soreness, warmth, or redness in your foot. Your feet are not getting better with treatment. Your symptoms get worse. You have new symptoms. You have severe pain. Summary Athlete's foot (tinea pedis) is a fungal infection of the skin on your feet. It often occurs on skin that is between or underneath the toes. This condition is caused by a fungus that grows in warm, moist places. Symptoms include white, flaky, or scaly areas between your toes or on the soles of your feet. This condition is treated with antifungal medicines. Keep your feet clean. Always dry them thoroughly. This information is not intended to replace advice given to you by   your health care provider. Make sure you discuss any questions you have with your health care provider. Document Revised: 12/14/2020 Document Reviewed: 12/14/2020 Elsevier Patient Education  2023 [employer] Inc[employer]  

## 2022-02-15 ENCOUNTER — Other Ambulatory Visit (HOSPITAL_COMMUNITY): Payer: Self-pay

## 2022-02-16 ENCOUNTER — Other Ambulatory Visit (HOSPITAL_COMMUNITY): Payer: Self-pay

## 2022-02-17 NOTE — Progress Notes (Signed)
  Subjective:  Patient ID: William Barajas, male    DOB: 02-28-1956,  MRN: [id number]  William Barajas presents to clinic today for callus(es) b/l lower extremities and painful thick toenails that are difficult to trim. Painful toenails interfere with ambulation. Aggravating factors include wearing enclosed shoe gear. Pain is relieved with periodic professional debridement. Painful calluses are aggravated when weightbearing with and without shoegear. Pain is relieved with periodic professional debridement.  His daughter is present during today's visit. Daughter states he has not been applying his antifungal cream as directed.  New problem(s): None.   PCP is Renee Rival, FNP , and last visit was February 11, 2022.  No Known Allergies  Review of Systems: Negative except as noted in the HPI.  Objective:  Vascular Examination: Vascular status intact b/l with palpable pedal pulses. CFT immediate b/l. No edema. No pain with calf compression b/l. Skin temperature gradient WNL b/l.   Neurological Examination: Sensation grossly intact b/l with 10 gram monofilament. Vibratory sensation intact b/l.   Dermatological Examination: Pedal skin with normal turgor, texture and tone b/l. Toenails 1-5 b/l thick, discolored, elongated with subungual debris and pain on dorsal palpation. Porokeratotic lesion(s) resolved bilaterally. Moderately dry skin scaly with odor noted b/l feet.  Musculoskeletal Examination: Muscle strength 5/5 to b/l LE. Plantarflexed metatarsals b/l feet.  Assessment/Plan: 1. Pain due to onychomycosis of toenails of both feet   2. Tinea pedis of both feet   -Examined patient. -Mycotic toenails 1-5 bilaterally were debrided in length and girth with sterile nail nippers and dremel without incident. -For tinea pedis, Rx sent to pharmacy for Ciclopirox Cream 0.77% to be applied to both feet twice daily. -Patient/POA to call should there be question/concern in the interim.    Return in about 3 months (around 05/15/2022).  Marzetta Board, DPM

## 2022-05-18 ENCOUNTER — Ambulatory Visit: Payer: Medicare Other | Admitting: Nurse Practitioner

## 2022-05-21 ENCOUNTER — Ambulatory Visit: Payer: 59 | Admitting: Nurse Practitioner

## 2022-05-21 ENCOUNTER — Encounter: Payer: Self-pay | Admitting: Nurse Practitioner

## 2022-05-21 VITALS — BP 165/90 | HR 79 | Ht 66.0 in | Wt 195.0 lb

## 2022-05-21 DIAGNOSIS — Z23 Encounter for immunization: Secondary | ICD-10-CM | POA: Diagnosis not present

## 2022-05-21 DIAGNOSIS — I10 Essential (primary) hypertension: Secondary | ICD-10-CM

## 2022-05-21 DIAGNOSIS — H543 Unqualified visual loss, both eyes: Secondary | ICD-10-CM

## 2022-05-21 NOTE — Assessment & Plan Note (Signed)
Patient educated on CDC recommendation for the shingles  vaccine. Verbal consent was obtained from the patient, vaccine administered by nurse, no sign of adverse reactions noted at this time. Patient education on arm soreness and use of tylenol  for this patient  was discussed. Patient educated on the signs and symptoms of adverse effect and advise to contact the office if they occur. Vaccine information sheet given to patient.

## 2022-05-21 NOTE — Progress Notes (Signed)
Established Patient Office Visit  Subjective:  Patient ID: William Barajas, male    DOB: June 23, 1956  Age: 66 y.o. MRN: [id number]  CC:  Chief Complaint  Patient presents with   Hypertension    F/u    HPI MARJORIE LUSSIER is a 66 y.o. male with past medical history of hypertension, presents for f/u for hypertension. Today patient is accompanied by his daughter Kenney Houseman. Patient is currently living with his sister .   Hypertension. Chronic condition, patient has not been taking his blood pressure medication, no reason was given for not taking his medication. He denies chest pain, syncope , dizziness. He has non pitting edema to bilateral lower extremities   Patients daughter requested for an  eye examination for the patient , saying that she noticed that he squints. The patient denies eye pain, eye pressure, photophobia.     Past Medical History:  Diagnosis Date   Bursitis, shoulder 10/30/2012   Carpal tunnel syndrome on left 11/08/2012   Hypertension    Muscle weakness (generalized) 12/01/2012   Pain in joint, shoulder region 12/01/2012   Rotator cuff syndrome of left shoulder 12/01/2012    Past Surgical History:  Procedure Laterality Date   COLONOSCOPY WITH PROPOFOL N/A 03/13/2021   Procedure: COLONOSCOPY WITH PROPOFOL;  Surgeon: Eloise Harman, DO;  Location: [facility];  Service: Endoscopy;  Laterality: N/A;  ASA II /12:30   KNEE SURGERY Right    POLYPECTOMY  03/13/2021   Procedure: POLYPECTOMY;  Surgeon: Eloise Harman, DO;  Location: [facility];  Service: Endoscopy;;    Family History  Problem Relation Age of Onset   Arthritis Mother    Bone cancer Mother    Cancer - Lung Father    Breast cancer Sister    Hypertension Sister    Hyperlipidemia Sister    Cancer Other    Asthma Other    Diabetes Other    Cancer - Prostate Neg Hx    Colon cancer Neg Hx     Social History   Socioeconomic History   Marital status: Single    Spouse name: Not on file    Number of children: 1   Years of education: Not on file   Highest education level: Not on file  Occupational History   Not on file  Tobacco Use   Smoking status: Never   Smokeless tobacco: Never  Substance and Sexual Activity   Alcohol use: No   Drug use: No   Sexual activity: Not on file  Other Topics Concern   Not on file  Social History Narrative   Lives with his sister, works at [employer].    Social Determinants of Health   Financial Resource Strain: Not on file  Food Insecurity: Not on file  Transportation Needs: Not on file  Physical Activity: Not on file  Stress: Not on file  Social Connections: Not on file  Intimate Partner Violence: Not on file    Outpatient Medications Prior to Visit  Medication Sig Dispense Refill   ciclopirox (LOPROX) 0.77 % cream Apply 1 application topically to both feet and between toes 2 (two) times daily. (Patient not taking: Reported on 05/21/2022) 90 g 1   UNABLE TO FIND 1 each by Other route daily. Med Name: Blood pressure cuff dx: I10 (Patient not taking: Reported on 02/11/2022) 1 each 0   valsartan-hydrochlorothiazide (DIOVAN-HCT) 80-12.5 MG tablet Take 1 tablet by mouth daily. (Patient not taking: Reported on 05/21/2022) 90 tablet 3  No facility-administered medications prior to visit.    No Known Allergies  ROS Review of Systems  Constitutional:  Negative for activity change, appetite change and chills.  HENT: Negative.  Negative for congestion, dental problem and drooling.   Eyes:  Positive for visual disturbance. Negative for photophobia, pain, discharge, redness and itching.  Respiratory: Negative.  Negative for apnea, cough, choking, chest tightness, shortness of breath and wheezing.   Cardiovascular: Negative.  Negative for chest pain, palpitations and leg swelling.  Musculoskeletal: Negative.   Neurological: Negative.  Negative for dizziness, seizures and numbness.  Psychiatric/Behavioral: Negative.  Negative for agitation,  behavioral problems and confusion.       Objective:    Physical Exam Constitutional:      General: He is not in acute distress.    Appearance: He is obese. He is not ill-appearing, toxic-appearing or diaphoretic.  Eyes:     General: No scleral icterus.       Right eye: No discharge.        Left eye: No discharge.     Extraocular Movements: Extraocular movements intact.     Conjunctiva/sclera: Conjunctivae normal.     Pupils: Pupils are equal, round, and reactive to light.  Cardiovascular:     Rate and Rhythm: Normal rate and regular rhythm.     Pulses: Normal pulses.     Heart sounds: Normal heart sounds. No murmur heard.    No friction rub. No gallop.  Pulmonary:     Effort: Pulmonary effort is normal. No respiratory distress.     Breath sounds: Normal breath sounds. No stridor. No wheezing, rhonchi or rales.  Chest:     Chest wall: No tenderness.  Neurological:     Mental Status: He is alert and oriented to person, place, and time.     Cranial Nerves: No cranial nerve deficit.     Motor: No weakness.     Gait: Gait normal.  Psychiatric:        Mood and Affect: Mood normal.        Behavior: Behavior normal.        Thought Content: Thought content normal.        Judgment: Judgment normal.     BP (!) 165/90   Pulse 79   Ht _0  (1.676 m)   Wt 195 lb (88.5 kg)   SpO2 96%   BMI 31.47 kg/m  Wt Readings from Last 3 Encounters:  05/21/22 195 lb (88.5 kg)  02/11/22 190 lb (86.2 kg)  01/14/22 187 lb (84.8 kg)    No results found for: "TSH" Lab Results  Component Value Date   WBC 6.2 01/16/2021   HGB 14.5 01/16/2021   HCT 42.4 01/16/2021   MCV 89.3 01/16/2021   PLT 228 01/16/2021   Lab Results  Component Value Date   NA 142 02/03/2022   K 4.0 02/03/2022   CO2 23 02/03/2022   GLUCOSE 95 02/03/2022   BUN 16 02/03/2022   CREATININE 1.05 02/03/2022   BILITOT 0.5 01/16/2021   AST 18 01/16/2021   ALT 17 01/16/2021   PROT 6.6 01/16/2021   CALCIUM 9.1  02/03/2022   ANIONGAP 7 03/11/2021   EGFR 79 02/03/2022   Lab Results  Component Value Date   CHOL 195 01/16/2021   Lab Results  Component Value Date   HDL 63 01/16/2021   Lab Results  Component Value Date   LDLCALC 107 (H) 01/16/2021   Lab Results  Component Value Date  TRIG 135 01/16/2021   Lab Results  Component Value Date   CHOLHDL 3.1 01/16/2021   No results found for: "HGBA1C"    Assessment & Plan:   Problem List Items Addressed This Visit       Cardiovascular and Mediastinum   Essential hypertension - Primary    BP Readings from Last 3 Encounters:  05/21/22 (!) 165/90  02/11/22 (!) 160/98  01/14/22 (!) 162/98  chronic uncontrolled condition Patient is still not taking his blood pressure medication, he gives no reason for not taking his medication.  Patient is accompanied to today's visit by his daughter.  I encouraged patient to start taking his medication as ordered.  He has agreed to start taking his medication daily at nighttime.  Patient's daughter was encouraged to call on the patient daily and remind him to take his medication. Start valsartan-hydrochlorothiazide 80-12.5 mg, 1 tablet daily. DASH diet advised patient encouraged to engage in regular moderate exercises at least 150 minutes weekly. Risk of kidney damage, MI, stroke, eye damage due to uncontrolled hypertension discussed with patient and his daughter they verbalized understanding. BMP in 2 weeks Follow-up in 4 weeks      Relevant Orders   Ambulatory referral to Ophthalmology   Basic Metabolic Panel (BMET)     Other   Need for shingles vaccine    Patient educated on CDC recommendation for the shingles  vaccine. Verbal consent was obtained from the patient, vaccine administered by nurse, no sign of adverse reactions noted at this time. Patient education on arm soreness and use of tylenol  for this patient  was discussed. Patient educated on the signs and symptoms of adverse effect and advise  to contact the office if they occur. Vaccine information sheet given to patient.       Relevant Orders   Varicella-zoster vaccine IM (Completed)   Need for influenza vaccination    Patient educated on CDC recommendation for the influenza vaccine. Verbal consent was obtained from the patient, vaccine administered by nurse, no sign of adverse reactions noted at this time. Patient education on arm soreness and use of tylenol  for this patient  was discussed. Patient educated on the signs and symptoms of adverse effect and advise to contact the office if they occur. Vaccine information sheet given to patient.       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   Follow-up: Return in about 4 weeks (around 06/18/2022) for CPE/HTN.    Renee Rival, FNP

## 2022-05-21 NOTE — Assessment & Plan Note (Signed)
Patient educated on CDC recommendation for the influenza vaccine. Verbal consent was obtained from the patient, vaccine administered by nurse, no sign of adverse reactions noted at this time. Patient education on arm soreness and use of tylenol  for this patient  was discussed. Patient educated on the signs and symptoms of adverse effect and advise to contact the office if they occur. Vaccine information sheet given to patient.

## 2022-05-21 NOTE — Assessment & Plan Note (Signed)
Patient referred to opthalmology Need to get BP under control dicussed

## 2022-05-21 NOTE — Assessment & Plan Note (Signed)
BP Readings from Last 3 Encounters:  05/21/22 (!) 165/90  02/11/22 (!) 160/98  01/14/22 (!) 162/98  chronic uncontrolled condition Patient is still not taking his blood pressure medication, he gives no reason for not taking his medication.  Patient is accompanied to today's visit by his daughter.  I encouraged patient to start taking his medication as ordered.  He has agreed to start taking his medication daily at nighttime.  Patient's daughter was encouraged to call on the patient daily and remind him to take his medication. Start valsartan-hydrochlorothiazide 80-12.5 mg, 1 tablet daily. DASH diet advised patient encouraged to engage in regular moderate exercises at least 150 minutes weekly. Risk of kidney damage, MI, stroke, eye damage due to uncontrolled hypertension discussed with patient and his daughter they verbalized understanding. BMP in 2 weeks Follow-up in 4 weeks

## 2022-05-21 NOTE — Patient Instructions (Addendum)
Please start taking your blood pressure medication daily as we discussed today. It is very important that you take your medication daily to avoid stroke, heart attack, kidney damage Please take valsartan-hydrochlorothiazide 80-12.5 mg (1 tablet daily) in the evenings as discussed The goal for your blood pressure is less than 140/90. Please monitor your blood pressure daily write down the numbers and bring to your next follow-up appointment in 1 month.  Please avoid salty diet engage in regular moderate exercises at least 150 minutes weekly   It is important that you exercise regularly at least 30 minutes 5 times a week.  Think about what you will eat, plan ahead. Choose " clean, green, fresh or frozen" over canned, processed or packaged foods which are more sugary, salty and fatty. 70 to 75% of food eaten should be vegetables and fruit. Three meals at set times with snacks allowed between meals, but they must be fruit or vegetables. Aim to eat over a 12 hour period , example 7 am to 7 pm, and STOP after  your last meal of the day. Drink water,generally about 64 ounces per day, no other drink is as healthy. Fruit juice is best enjoyed in a healthy way, by EATING the fruit.  Thanks for choosing [facility] we consider it a privelige to serve you.

## 2022-05-28 ENCOUNTER — Ambulatory Visit: Payer: 59 | Admitting: Podiatry

## 2022-06-04 DIAGNOSIS — I10 Essential (primary) hypertension: Secondary | ICD-10-CM | POA: Diagnosis not present

## 2022-06-05 LAB — BASIC METABOLIC PANEL
BUN/Creatinine Ratio: 13 (ref 10–24)
BUN: 16 mg/dL (ref 8–27)
CO2: 25 mmol/L (ref 20–29)
Calcium: 9.7 mg/dL (ref 8.6–10.2)
Chloride: 103 mmol/L (ref 96–106)
Creatinine, Ser: 1.26 mg/dL (ref 0.76–1.27)
Glucose: 90 mg/dL (ref 70–99)
Potassium: 4.4 mmol/L (ref 3.5–5.2)
Sodium: 143 mmol/L (ref 134–144)
eGFR: 63 mL/min/{1.73_m2} (ref >59)

## 2022-06-05 NOTE — Progress Notes (Signed)
Stable labs

## 2022-06-11 ENCOUNTER — Ambulatory Visit: Payer: Medicare Other | Admitting: Podiatry

## 2022-06-18 ENCOUNTER — Ambulatory Visit (INDEPENDENT_AMBULATORY_CARE_PROVIDER_SITE_OTHER): Payer: 59 | Admitting: Internal Medicine

## 2022-06-18 ENCOUNTER — Encounter: Payer: Self-pay | Admitting: Internal Medicine

## 2022-06-18 VITALS — BP 157/90 | HR 85 | Ht 66.0 in | Wt 194.0 lb

## 2022-06-18 DIAGNOSIS — Z0000 Encounter for general adult medical examination without abnormal findings: Secondary | ICD-10-CM | POA: Diagnosis not present

## 2022-06-18 DIAGNOSIS — Z23 Encounter for immunization: Secondary | ICD-10-CM

## 2022-06-18 DIAGNOSIS — Z0001 Encounter for general adult medical examination with abnormal findings: Secondary | ICD-10-CM | POA: Diagnosis not present

## 2022-06-18 DIAGNOSIS — I10 Essential (primary) hypertension: Secondary | ICD-10-CM | POA: Diagnosis not present

## 2022-06-18 NOTE — Progress Notes (Unsigned)
Complete physical exam  Patient: William Barajas   DOB: [birth year]   66 y.o. Male  MRN: [id number]  Subjective:    Chief Complaint  Patient presents with   Annual Exam   William Barajas is a 66 y.o. male who presents today for a complete physical exam. He reports consuming a general diet. The patient has a physically strenuous job, but has no regular exercise apart from work.  He generally feels well. He reports sleeping well. He does not have additional problems to discuss today.   Most recent fall risk assessment:    06/18/2022    2:07 PM  [place] in the past year? 0  Number falls in past yr: 0  Injury with Fall? 0  Risk for fall due to : No Fall Risks  Follow up Falls evaluation completed    Most recent depression screenings:    06/18/2022    2:07 PM 05/21/2022    3:04 PM  PHQ 2/9 Scores  PHQ - 2 Score 0 0   Past Medical History:  Diagnosis Date   Bursitis, shoulder 10/30/2012   Carpal tunnel syndrome on left 11/08/2012   Hypertension    Muscle weakness (generalized) 12/01/2012   Pain in joint, shoulder region 12/01/2012   Rotator cuff syndrome of left shoulder 12/01/2012   Past Surgical History:  Procedure Laterality Date   COLONOSCOPY WITH PROPOFOL N/A 03/13/2021   Procedure: COLONOSCOPY WITH PROPOFOL;  Surgeon: Eloise Harman, DO;  Location: [facility];  Service: Endoscopy;  Laterality: N/A;  ASA II /12:30   KNEE SURGERY Right    POLYPECTOMY  03/13/2021   Procedure: POLYPECTOMY;  Surgeon: Eloise Harman, DO;  Location: [facility];  Service: Endoscopy;;   Social History   Tobacco Use   Smoking status: Never   Smokeless tobacco: Never  Substance Use Topics   Alcohol use: No   Drug use: No   Family History  Problem Relation Age of Onset   Arthritis Mother    Bone cancer Mother    Cancer - Lung Father    Breast cancer Sister    Hypertension Sister    Hyperlipidemia Sister    Cancer Other    Asthma Other    Diabetes Other    Cancer  - Prostate Neg Hx    Colon cancer Neg Hx    No Known Allergies   Patient Care Team: Johnette Abraham, MD as PCP - General (Internal Medicine) Eloise Harman, DO as Consulting Physician (Gastroenterology)   Outpatient Medications Prior to Visit  Medication Sig   ciclopirox (LOPROX) 0.77 % cream Apply 1 application topically to both feet and between toes 2 (two) times daily.   valsartan-hydrochlorothiazide (DIOVAN-HCT) 80-12.5 MG tablet Take 1 tablet by mouth daily.   UNABLE TO FIND 1 each by Other route daily. Med Name: Blood pressure cuff dx: I10 (Patient not taking: Reported on 02/11/2022)   No facility-administered medications prior to visit.   Review of Systems  Constitutional:  Positive for malaise/fatigue. Negative for chills and fever.  HENT:  Negative for sore throat.   Respiratory:  Negative for cough and shortness of breath.   Cardiovascular:  Negative for chest pain, palpitations and leg swelling.  Gastrointestinal:  Negative for abdominal pain, blood in stool, constipation, diarrhea, nausea and vomiting.  Genitourinary:  Negative for dysuria and hematuria.  Musculoskeletal:  Negative for myalgias.  Skin:  Negative for itching and rash.  Neurological:  Negative for  dizziness and headaches.  Psychiatric/Behavioral:  Negative for depression and suicidal ideas.       Objective:     BP (!) 157/90   Pulse 85   Ht 5' 6" (1.676 m)   Wt 194 lb (88 kg)   SpO2 96%   BMI 31.31 kg/m  BP Readings from Last 3 Encounters:  06/18/22 (!) 157/90  05/21/22 (!) 165/90  02/11/22 (!) 160/98    Physical Exam Vitals reviewed.  Constitutional:      General: He is not in acute distress.    Appearance: Normal appearance. He is obese. He is not ill-appearing.  HENT:     Head: Normocephalic and atraumatic.     Nose: Nose normal. No congestion or rhinorrhea.     Mouth/Throat:     Mouth: Mucous membranes are moist.     Pharynx: Oropharynx is clear.  Eyes:     Extraocular  Movements: Extraocular movements intact.     Conjunctiva/sclera: Conjunctivae normal.     Pupils: Pupils are equal, round, and reactive to light.  Cardiovascular:     Rate and Rhythm: Normal rate and regular rhythm.     Pulses: Normal pulses.     Heart sounds: Normal heart sounds. No murmur heard. Pulmonary:     Effort: Pulmonary effort is normal.     Breath sounds: Normal breath sounds. No wheezing, rhonchi or rales.  Abdominal:     General: Abdomen is flat. Bowel sounds are normal. There is no distension.     Palpations: Abdomen is soft.     Tenderness: There is no abdominal tenderness.  Musculoskeletal:        General: No swelling or deformity. Normal range of motion.     Cervical back: Normal range of motion.  Skin:    General: Skin is warm and dry.     Capillary Refill: Capillary refill takes less than 2 seconds.  Neurological:     General: No focal deficit present.     Mental Status: He is alert and oriented to person, place, and time.     Motor: No weakness.  Psychiatric:        Mood and Affect: Mood normal.        Behavior: Behavior normal.        Thought Content: Thought content normal.    Last CBC Lab Results  Component Value Date   WBC 6.6 06/18/2022   HGB 15.5 06/18/2022   HCT 44.8 06/18/2022   MCV 88 06/18/2022   MCH 30.5 06/18/2022   RDW 12.8 06/18/2022   PLT 246 22/63/3354   Last metabolic panel Lab Results  Component Value Date   GLUCOSE 91 06/18/2022   NA 141 06/18/2022   K 4.3 06/18/2022   CL 101 06/18/2022   CO2 28 06/18/2022   BUN 20 06/18/2022   CREATININE 1.24 06/18/2022   EGFR 65 06/18/2022   CALCIUM 9.9 06/18/2022   PROT 7.0 06/18/2022   ALBUMIN 4.6 06/18/2022   LABGLOB 2.4 06/18/2022   AGRATIO 1.9 06/18/2022   BILITOT 0.4 06/18/2022   ALKPHOS 102 06/18/2022   AST 20 06/18/2022   ALT 22 06/18/2022   ANIONGAP 7 03/11/2021   Last lipids Lab Results  Component Value Date   CHOL 224 (H) 06/18/2022   HDL 61 06/18/2022   LDLCALC  149 (H) 06/18/2022   TRIG 81 06/18/2022   CHOLHDL 3.7 06/18/2022   Last hemoglobin A1c Lab Results  Component Value Date   HGBA1C 5.7 (H) 06/18/2022  Assessment & Plan:    Routine Health Maintenance and Physical Exam  Immunization History  Administered Date(s) Administered   Fluad Quad(high Dose 65+) 05/21/2022   Moderna SARS-COV2 Booster Vaccination 12/17/2020   Moderna Sars-Covid-2 Vaccination 10/02/2019, 10/30/2019   PNEUMOCOCCAL CONJUGATE-20 06/18/2022   Tdap 02/11/2022   Zoster Recombinat (Shingrix) 05/21/2022    Health Maintenance  Topic Date Due   COVID-19 Vaccine (3 - Moderna series) 02/11/2021   Zoster Vaccines- Shingrix (2 of 2) 07/16/2022   COLONOSCOPY (Pts 45-25yr Insurance coverage will need to be confirmed)  03/14/2031   TETANUS/TDAP  02/12/2032   Pneumonia Vaccine 66 Years old  Completed   INFLUENZA VACCINE  Completed   Hepatitis C Screening  Completed   HIV Screening  Completed   HPV VACCINES  Aged Out    Discussed health benefits of physical activity, and encouraged him to engage in regular exercise appropriate for his age and condition.  Problem List Items Addressed This Visit       Essential hypertension    BP elevated again today.  He is currently prescribed valsartan-HCTZ 80-12.5 mg daily.  He states that he has not taken his medication yet but is planning to when he gets home. -Follow-up in 2 weeks for BP check.  If BP remains elevated at that time we will make appropriate medication adjustments.      Encounter for general adult medical examination with abnormal findings    Presenting today for his annual physical exam. -Repeat labs ordered -PCV 20 administered today -He is planning to get the COVID-19 bi-valent booster.  I requested that he bring documentation of vaccination to his next appointment so that we can update our records.      Return in about 2 weeks (around 07/02/2022).   PJohnette Abraham MD

## 2022-06-18 NOTE — Patient Instructions (Signed)
It was a pleasure to see you today.  Thank you for giving [place] the opportunity to be involved in your care.  Below is a brief recap of your visit and next steps.  We will plan to see you again in 2 weeks.   Summary We completed your annual exam today We will follow up in 2 weeks for a blood pressure check

## 2022-06-19 LAB — CBC WITH DIFFERENTIAL/PLATELET
Basophils Absolute: 0 10*3/uL (ref 0.0–0.2)
Basos: 1 %
EOS (ABSOLUTE): 0.4 10*3/uL (ref 0.0–0.4)
Eos: 5 %
Hematocrit: 44.8 % (ref 37.5–51.0)
Hemoglobin: 15.5 g/dL (ref 13.0–17.7)
Immature Grans (Abs): 0 10*3/uL (ref 0.0–0.1)
Immature Granulocytes: 0 %
Lymphocytes Absolute: 0.9 10*3/uL (ref 0.7–3.1)
Lymphs: 14 %
MCH: 30.5 pg (ref 26.6–33.0)
MCHC: 34.6 g/dL (ref 31.5–35.7)
MCV: 88 fL (ref 79–97)
Monocytes Absolute: 0.6 10*3/uL (ref 0.1–0.9)
Monocytes: 9 %
Neutrophils Absolute: 4.7 10*3/uL (ref 1.4–7.0)
Neutrophils: 71 %
Platelets: 246 10*3/uL (ref 150–450)
RBC: 5.09 x10E6/uL (ref 4.14–5.80)
RDW: 12.8 % (ref 11.6–15.4)
WBC: 6.6 10*3/uL (ref 3.4–10.8)

## 2022-06-19 LAB — LIPID PANEL
Chol/HDL Ratio: 3.7 ratio (ref 0.0–5.0)
Cholesterol, Total: 224 mg/dL — ABNORMAL HIGH (ref 100–199)
HDL: 61 mg/dL (ref >39)
LDL Chol Calc (NIH): 149 mg/dL — ABNORMAL HIGH (ref 0–99)
Triglycerides: 81 mg/dL (ref 0–149)
VLDL Cholesterol Cal: 14 mg/dL (ref 5–40)

## 2022-06-19 LAB — CMP14+EGFR
ALT: 22 IU/L (ref 0–44)
AST: 20 IU/L (ref 0–40)
Albumin/Globulin Ratio: 1.9 (ref 1.2–2.2)
Albumin: 4.6 g/dL (ref 3.9–4.9)
Alkaline Phosphatase: 102 IU/L (ref 44–121)
BUN/Creatinine Ratio: 16 (ref 10–24)
BUN: 20 mg/dL (ref 8–27)
Bilirubin Total: 0.4 mg/dL (ref 0.0–1.2)
CO2: 28 mmol/L (ref 20–29)
Calcium: 9.9 mg/dL (ref 8.6–10.2)
Chloride: 101 mmol/L (ref 96–106)
Creatinine, Ser: 1.24 mg/dL (ref 0.76–1.27)
Globulin, Total: 2.4 g/dL (ref 1.5–4.5)
Glucose: 91 mg/dL (ref 70–99)
Potassium: 4.3 mmol/L (ref 3.5–5.2)
Sodium: 141 mmol/L (ref 134–144)
Total Protein: 7 g/dL (ref 6.0–8.5)
eGFR: 65 mL/min/{1.73_m2} (ref >59)

## 2022-06-19 LAB — HEMOGLOBIN A1C
Est. average glucose Bld gHb Est-mCnc: 117 mg/dL
Hgb A1c MFr Bld: 5.7 % — ABNORMAL HIGH (ref 4.8–5.6)

## 2022-06-23 ENCOUNTER — Encounter: Payer: Self-pay | Admitting: Internal Medicine

## 2022-06-23 DIAGNOSIS — Z0001 Encounter for general adult medical examination with abnormal findings: Secondary | ICD-10-CM | POA: Insufficient documentation

## 2022-06-23 NOTE — Assessment & Plan Note (Addendum)
Presenting today for his annual physical exam. -Repeat labs ordered -PCV 20 administered today -He is planning to get the COVID-19 bi-valent booster.  I requested that he bring documentation of vaccination to his next appointment so that we can update our records.

## 2022-06-23 NOTE — Assessment & Plan Note (Signed)
BP elevated again today.  He is currently prescribed valsartan-HCTZ 80-12.5 mg daily.  He states that he has not taken his medication yet but is planning to when he gets home. -Follow-up in 2 weeks for BP check.  If BP remains elevated at that time we will make appropriate medication adjustments.

## 2022-07-02 ENCOUNTER — Ambulatory Visit: Payer: Medicare Other | Admitting: Podiatry

## 2022-07-05 ENCOUNTER — Encounter: Payer: Self-pay | Admitting: Internal Medicine

## 2022-07-05 ENCOUNTER — Ambulatory Visit: Payer: 59 | Admitting: Internal Medicine

## 2022-07-05 ENCOUNTER — Other Ambulatory Visit (HOSPITAL_COMMUNITY): Payer: Self-pay

## 2022-07-05 VITALS — BP 156/88 | HR 89 | Ht 66.0 in | Wt 195.4 lb

## 2022-07-05 DIAGNOSIS — R7303 Prediabetes: Secondary | ICD-10-CM | POA: Diagnosis not present

## 2022-07-05 DIAGNOSIS — I10 Essential (primary) hypertension: Secondary | ICD-10-CM

## 2022-07-05 DIAGNOSIS — R5383 Other fatigue: Secondary | ICD-10-CM | POA: Diagnosis not present

## 2022-07-05 DIAGNOSIS — E785 Hyperlipidemia, unspecified: Secondary | ICD-10-CM

## 2022-07-05 MED ORDER — VALSARTAN-HYDROCHLOROTHIAZIDE 160-12.5 MG PO TABS
1.0000 | ORAL_TABLET | Freq: Every day | ORAL | 3 refills | Status: DC
  Filled 2022-07-05: qty 90, 90d supply, fill #0
  Filled 2022-10-09: qty 90, 90d supply, fill #1
  Filled 2023-01-16: qty 90, 90d supply, fill #2
  Filled 2023-05-08: qty 90, 90d supply, fill #3

## 2022-07-05 MED ORDER — ATORVASTATIN CALCIUM 40 MG PO TABS
40.0000 mg | ORAL_TABLET | Freq: Every day | ORAL | 3 refills | Status: DC
  Filled 2022-07-05: qty 90, 90d supply, fill #0
  Filled 2022-10-09: qty 90, 90d supply, fill #1
  Filled 2023-01-16: qty 90, 90d supply, fill #2
  Filled 2023-05-08: qty 90, 90d supply, fill #3

## 2022-07-05 NOTE — Assessment & Plan Note (Signed)
He endorses a several day history of fatigue and is concerned about his iron level, requests iron studies to be ordered today.  He denies melena/hematochezia.  No B symptoms identified. -Iron studies ordered -If symptoms persist, can consider additional lab work-up

## 2022-07-05 NOTE — Assessment & Plan Note (Signed)
BP remains elevated today, 156/88.  He is currently prescribed valsartan-HCTZ 80-12.5 mg daily.  He states that he takes his medications at night and took valsartan-HCTZ as prescribed last night. -Increase valsartan-HCTZ to 160-12.5 mg daily -Follow-up in 4 weeks for BP check

## 2022-07-05 NOTE — Patient Instructions (Signed)
It was a pleasure to see you today.  Thank you for giving [place] the opportunity to be involved in your care.  Below is a brief recap of your visit and next steps.  We will plan to see you again in 4 weeks.  Summary We have increased valsartan-hctz to 160-12.5 mg daily I have prescribed atorvastatin 40 mg daily for your cholesterol We will follow up in 4 weeks for BP check

## 2022-07-05 NOTE — Progress Notes (Signed)
Established Patient Office Visit  Subjective   Patient ID: William Barajas, male    DOB: Jun 15, 1956  Age: 66 y.o. MRN: [id number]  Chief Complaint  Patient presents with   Follow-up   William Barajas returns to care today.  He is a 66 year old male last seen by me on 10/13 for his annual physical exam.  At that time his blood pressure was elevated.  He stated that he had not taken his blood pressure medication yet.  2-week follow-up was arranged for BP check.  He is currently prescribed valsartan-HCTZ 80-12.5 mg daily.  Today he is additionally concerned about recent fatigue and requests to have his iron levels checked.  He states that he falls asleep when he gets home and in a comfortable position.  He denies melena/hematochezia.  He has not experienced any night sweats, fever/chills, or unintentional weight loss.  He has not appreciated any lymphadenopathy.  Acute concerns, chronic medical conditions, and outstanding preventative healthcare maintenance items discussed today are individually addressed in A/P below.  Past Medical History:  Diagnosis Date   Bursitis, shoulder 10/30/2012   Carpal tunnel syndrome on left 11/08/2012   Hypertension    Muscle weakness (generalized) 12/01/2012   Pain in joint, shoulder region 12/01/2012   Rotator cuff syndrome of left shoulder 12/01/2012   Past Surgical History:  Procedure Laterality Date   COLONOSCOPY WITH PROPOFOL N/A 03/13/2021   Procedure: COLONOSCOPY WITH PROPOFOL;  Surgeon: Eloise Harman, DO;  Location: [facility];  Service: Endoscopy;  Laterality: N/A;  ASA II /12:30   KNEE SURGERY Right    POLYPECTOMY  03/13/2021   Procedure: POLYPECTOMY;  Surgeon: Eloise Harman, DO;  Location: [facility];  Service: Endoscopy;;   Social History   Tobacco Use   Smoking status: Never   Smokeless tobacco: Never  Substance Use Topics   Alcohol use: No   Drug use: No   Family History  Problem Relation Age of Onset   Arthritis Mother    Bone  cancer Mother    Cancer - Lung Father    Breast cancer Sister    Hypertension Sister    Hyperlipidemia Sister    Cancer Other    Asthma Other    Diabetes Other    Cancer - Prostate Neg Hx    Colon cancer Neg Hx    No Known Allergies  Review of Systems  Constitutional:  Positive for malaise/fatigue.  All other systems reviewed and are negative.    Objective:     BP (!) 156/88   Pulse 89   Ht _0  (1.676 m)   Wt 195 lb 6.4 oz (88.6 kg)   SpO2 97%   BMI 31.54 kg/m  BP Readings from Last 3 Encounters:  07/05/22 (!) 156/88  06/18/22 (!) 157/90  05/21/22 (!) 165/90   Physical Exam Vitals reviewed.  Constitutional:      General: He is not in acute distress.    Appearance: Normal appearance. He is obese. He is not ill-appearing.  HENT:     Head: Normocephalic and atraumatic.     Nose: Nose normal. No congestion or rhinorrhea.     Mouth/Throat:     Mouth: Mucous membranes are moist.     Pharynx: Oropharynx is clear.  Eyes:     Extraocular Movements: Extraocular movements intact.     Conjunctiva/sclera: Conjunctivae normal.     Pupils: Pupils are equal, round, and reactive to light.  Cardiovascular:     Rate and  Rhythm: Normal rate and regular rhythm.     Pulses: Normal pulses.     Heart sounds: Normal heart sounds. No murmur heard. Pulmonary:     Effort: Pulmonary effort is normal.     Breath sounds: Normal breath sounds. No wheezing, rhonchi or rales.  Abdominal:     General: Abdomen is flat. Bowel sounds are normal. There is no distension.     Palpations: Abdomen is soft.     Tenderness: There is no abdominal tenderness.  Musculoskeletal:        General: No swelling or deformity. Normal range of motion.     Cervical back: Normal range of motion.  Skin:    General: Skin is warm and dry.     Capillary Refill: Capillary refill takes less than 2 seconds.  Neurological:     General: No focal deficit present.     Mental Status: He is alert and oriented to  person, place, and time.     Motor: No weakness.  Psychiatric:        Mood and Affect: Mood normal.        Behavior: Behavior normal.        Thought Content: Thought content normal.    Last CBC Lab Results  Component Value Date   WBC 6.6 06/18/2022   HGB 15.5 06/18/2022   HCT 44.8 06/18/2022   MCV 88 06/18/2022   MCH 30.5 06/18/2022   RDW 12.8 06/18/2022   PLT 246 40/98/1191   Last metabolic panel Lab Results  Component Value Date   GLUCOSE 91 06/18/2022   NA 141 06/18/2022   K 4.3 06/18/2022   CL 101 06/18/2022   CO2 28 06/18/2022   BUN 20 06/18/2022   CREATININE 1.24 06/18/2022   EGFR 65 06/18/2022   CALCIUM 9.9 06/18/2022   PROT 7.0 06/18/2022   ALBUMIN 4.6 06/18/2022   LABGLOB 2.4 06/18/2022   AGRATIO 1.9 06/18/2022   BILITOT 0.4 06/18/2022   ALKPHOS 102 06/18/2022   AST 20 06/18/2022   ALT 22 06/18/2022   ANIONGAP 7 03/11/2021   Last lipids Lab Results  Component Value Date   CHOL 224 (H) 06/18/2022   HDL 61 06/18/2022   LDLCALC 149 (H) 06/18/2022   TRIG 81 06/18/2022   CHOLHDL 3.7 06/18/2022   Last hemoglobin A1c Lab Results  Component Value Date   HGBA1C 5.7 (H) 06/18/2022   The 10-year ASCVD risk score (Arnett DK, et al., 2019) is: 22.1%    Assessment & Plan:   Problem List Items Addressed This Visit       Essential hypertension    BP remains elevated today, 156/88.  He is currently prescribed valsartan-HCTZ 80-12.5 mg daily.  He states that he takes his medications at night and took valsartan-HCTZ as prescribed last night. -Increase valsartan-HCTZ to 160-12.5 mg daily -Follow-up in 4 weeks for BP check      Hyperlipidemia - Primary    Total cholesterol 224, LDL 149 on lipid panel from earlier this month.  His 10-year ASCVD risk score is 22.1%.  High intensity statin therapy is indicated.  I discussed this with William Barajas.  He is in agreement with high intensity statin therapy. -Atorvastatin 40 mg daily prescribed today       Prediabetes    A1c 5.7 on labs from earlier this month, placing him in the prediabetic range.  This was reviewed with the patient.  I recommended that he limit fatty/fried foods and incorporate regular exercise into his weekly routine.  Fatigue    He endorses a several day history of fatigue and is concerned about his iron level, requests iron studies to be ordered today.  He denies melena/hematochezia.  No B symptoms identified. -Iron studies ordered -If symptoms persist, can consider additional lab work-up       Return in about 4 weeks (around 08/02/2022).    Johnette Abraham, MD

## 2022-07-05 NOTE — Assessment & Plan Note (Signed)
Total cholesterol 224, LDL 149 on lipid panel from earlier this month.  His 10-year ASCVD risk score is 22.1%.  High intensity statin therapy is indicated.  I discussed this with William Barajas.  He is in agreement with high intensity statin therapy. -Atorvastatin 40 mg daily prescribed today

## 2022-07-05 NOTE — Assessment & Plan Note (Signed)
A1c 5.7 on labs from earlier this month, placing him in the prediabetic range.  This was reviewed with the patient.  I recommended that he limit fatty/fried foods and incorporate regular exercise into his weekly routine.

## 2022-07-07 LAB — IRON,TIBC AND FERRITIN PANEL
Ferritin: 301 ng/mL (ref 30–400)
Iron Saturation: 28 % (ref 15–55)
Iron: 74 ug/dL (ref 38–169)
Total Iron Binding Capacity: 264 ug/dL (ref 250–450)
UIBC: 190 ug/dL (ref 111–343)

## 2022-08-03 ENCOUNTER — Ambulatory Visit: Payer: 59 | Admitting: Internal Medicine

## 2022-08-06 ENCOUNTER — Ambulatory Visit: Payer: 59 | Admitting: Internal Medicine

## 2022-08-13 ENCOUNTER — Encounter: Payer: Self-pay | Admitting: Internal Medicine

## 2022-08-13 ENCOUNTER — Other Ambulatory Visit (HOSPITAL_COMMUNITY): Payer: Self-pay

## 2022-08-13 ENCOUNTER — Ambulatory Visit (INDEPENDENT_AMBULATORY_CARE_PROVIDER_SITE_OTHER): Payer: 59 | Admitting: Internal Medicine

## 2022-08-13 VITALS — BP 133/81 | HR 65 | Ht 66.0 in | Wt 191.6 lb

## 2022-08-13 DIAGNOSIS — R5383 Other fatigue: Secondary | ICD-10-CM

## 2022-08-13 DIAGNOSIS — N529 Male erectile dysfunction, unspecified: Secondary | ICD-10-CM

## 2022-08-13 DIAGNOSIS — E785 Hyperlipidemia, unspecified: Secondary | ICD-10-CM | POA: Diagnosis not present

## 2022-08-13 DIAGNOSIS — I10 Essential (primary) hypertension: Secondary | ICD-10-CM

## 2022-08-13 DIAGNOSIS — Z1321 Encounter for screening for nutritional disorder: Secondary | ICD-10-CM

## 2022-08-13 DIAGNOSIS — Z23 Encounter for immunization: Secondary | ICD-10-CM

## 2022-08-13 MED ORDER — SILDENAFIL CITRATE 50 MG PO TABS
50.0000 mg | ORAL_TABLET | Freq: Every day | ORAL | 0 refills | Status: AC | PRN
  Filled 2022-08-13: qty 6, 30d supply, fill #0
  Filled 2022-10-13: qty 4, 4d supply, fill #1
  Filled 2022-10-13: qty 4, 5d supply, fill #1

## 2022-08-13 NOTE — Progress Notes (Unsigned)
Established Patient Office Visit  Subjective   Patient ID: William Barajas, male    DOB: 02-07-1956  Age: 66 y.o. MRN: [id number]  Chief Complaint  Patient presents with   Hypertension    Follow up   Hyperlipidemia    Follow up   William Barajas returns to care today for HTN and HLD follow-up.  He was last seen by me on 10/30 at which time valsartan-HCTZ was increased to 160-12.5 mg daily.  Atorvastatin 40 mg daily was started for treatment of HLD.  There have been no acute interval events. Today William Barajas reports feeling well overall.  He continues to endorse fatigue.  Previously ordered iron studies were not consistent with iron deficiency.  His symptoms have not worsened, but have also not improved.  He is also interested in an as needed medication for erectile dysfunction.  Past Medical History:  Diagnosis Date   Bursitis, shoulder 10/30/2012   Carpal tunnel syndrome on left 11/08/2012   Hypertension    Muscle weakness (generalized) 12/01/2012   Pain in joint, shoulder region 12/01/2012   Rotator cuff syndrome of left shoulder 12/01/2012   Past Surgical History:  Procedure Laterality Date   COLONOSCOPY WITH PROPOFOL N/A 03/13/2021   Procedure: COLONOSCOPY WITH PROPOFOL;  Surgeon: Eloise Harman, DO;  Location: [facility];  Service: Endoscopy;  Laterality: N/A;  ASA II /12:30   KNEE SURGERY Right    POLYPECTOMY  03/13/2021   Procedure: POLYPECTOMY;  Surgeon: Eloise Harman, DO;  Location: [facility];  Service: Endoscopy;;   Social History   Tobacco Use   Smoking status: Never   Smokeless tobacco: Never  Substance Use Topics   Alcohol use: No   Drug use: No   Family History  Problem Relation Age of Onset   Arthritis Mother    Bone cancer Mother    Cancer - Lung Father    Breast cancer Sister    Hypertension Sister    Hyperlipidemia Sister    Cancer Other    Asthma Other    Diabetes Other    Cancer - Prostate Neg Hx    Colon cancer Neg Hx    No Known  Allergies  Review of Systems  Constitutional:  Positive for malaise/fatigue.  Genitourinary:        Erectile dysfunction     Objective:     BP 133/81   Pulse 65   Ht _0  (1.676 m)   Wt 191 lb 9.6 oz (86.9 kg)   SpO2 95%   BMI 30.93 kg/m  BP Readings from Last 3 Encounters:  08/13/22 133/81  07/05/22 (!) 156/88  06/18/22 (!) 157/90   Physical Exam Vitals reviewed.  Constitutional:      General: He is not in acute distress.    Appearance: Normal appearance. He is obese. He is not ill-appearing.  HENT:     Head: Normocephalic and atraumatic.     Nose: Nose normal. No congestion or rhinorrhea.     Mouth/Throat:     Mouth: Mucous membranes are moist.     Pharynx: Oropharynx is clear.  Eyes:     Extraocular Movements: Extraocular movements intact.     Conjunctiva/sclera: Conjunctivae normal.     Pupils: Pupils are equal, round, and reactive to light.  Cardiovascular:     Rate and Rhythm: Normal rate and regular rhythm.     Pulses: Normal pulses.     Heart sounds: Normal heart sounds. No murmur heard. Pulmonary:  Effort: Pulmonary effort is normal.     Breath sounds: Normal breath sounds. No wheezing, rhonchi or rales.  Abdominal:     General: Abdomen is flat. Bowel sounds are normal. There is no distension.     Palpations: Abdomen is soft.     Tenderness: There is no abdominal tenderness.  Musculoskeletal:        General: No swelling or deformity. Normal range of motion.     Cervical back: Normal range of motion.  Skin:    General: Skin is warm and dry.     Capillary Refill: Capillary refill takes less than 2 seconds.  Neurological:     General: No focal deficit present.     Mental Status: He is alert and oriented to person, place, and time.     Motor: No weakness.  Psychiatric:        Mood and Affect: Mood normal.        Behavior: Behavior normal.        Thought Content: Thought content normal.    Last CBC Lab Results  Component Value Date   WBC 6.6  06/18/2022   HGB 15.5 06/18/2022   HCT 44.8 06/18/2022   MCV 88 06/18/2022   MCH 30.5 06/18/2022   RDW 12.8 06/18/2022   PLT 246 03/05/1600   Last metabolic panel Lab Results  Component Value Date   GLUCOSE 83 08/13/2022   NA 145 (H) 08/13/2022   K 4.3 08/13/2022   CL 103 08/13/2022   CO2 28 08/13/2022   BUN 24 08/13/2022   CREATININE 1.37 (H) 08/13/2022   EGFR 57 (L) 08/13/2022   CALCIUM 9.7 08/13/2022   PROT 7.0 06/18/2022   ALBUMIN 4.6 06/18/2022   LABGLOB 2.4 06/18/2022   AGRATIO 1.9 06/18/2022   BILITOT 0.4 06/18/2022   ALKPHOS 102 06/18/2022   AST 20 06/18/2022   ALT 22 06/18/2022   ANIONGAP 7 03/11/2021   Last lipids Lab Results  Component Value Date   CHOL 132 08/13/2022   HDL 53 08/13/2022   LDLCALC 62 08/13/2022   TRIG 91 08/13/2022   CHOLHDL 2.5 08/13/2022   Last hemoglobin A1c Lab Results  Component Value Date   HGBA1C 5.7 (H) 06/18/2022   The 10-year ASCVD risk score (Arnett DK, et al., 2019) is: 15.1%    Assessment & Plan:   Problem List Items Addressed This Visit       Essential hypertension    He is currently prescribed valsartan-HCTZ 160-12.5 mg daily for treatment of hypertension.  His blood pressure today is 133/81.  No additional medication changes today.      Need for shingles vaccine    Second shingles vaccine administered today      Hyperlipidemia    Atorvastatin 40 mg daily was started at his appointment in late October due to poorly controlled HLD and an elevated 10-year ASCVD risk score. -Repeat lipid panel ordered today      Fatigue    Persistent issue.  Additional labs ordered today to complete workup.      Erectile dysfunction    Today he endorses difficulty with both achieving and maintaining erections.  He is interested in an as needed medication for ED. -Sildenafil 50 mg as needed prescribed today      Return in about 3 months (around 11/12/2022).    Johnette Abraham, MD

## 2022-08-13 NOTE — Patient Instructions (Signed)
It was a pleasure to see you today.  Thank you for giving [place] the opportunity to be involved in your care.  Below is a brief recap of your visit and next steps.  We will plan to see you again in 3 months.  Summary Start sildenafil today for erectile dysfunction Continue other medications as prescribed We will repeat bloodwork today Follow up in 3 months

## 2022-08-14 LAB — BASIC METABOLIC PANEL
BUN/Creatinine Ratio: 18 (ref 10–24)
BUN: 24 mg/dL (ref 8–27)
CO2: 28 mmol/L (ref 20–29)
Calcium: 9.7 mg/dL (ref 8.6–10.2)
Chloride: 103 mmol/L (ref 96–106)
Creatinine, Ser: 1.37 mg/dL — ABNORMAL HIGH (ref 0.76–1.27)
Glucose: 83 mg/dL (ref 70–99)
Potassium: 4.3 mmol/L (ref 3.5–5.2)
Sodium: 145 mmol/L — ABNORMAL HIGH (ref 134–144)
eGFR: 57 mL/min/{1.73_m2} — ABNORMAL LOW (ref >59)

## 2022-08-14 LAB — LIPID PANEL
Chol/HDL Ratio: 2.5 ratio (ref 0.0–5.0)
Cholesterol, Total: 132 mg/dL (ref 100–199)
HDL: 53 mg/dL (ref >39)
LDL Chol Calc (NIH): 62 mg/dL (ref 0–99)
Triglycerides: 91 mg/dL (ref 0–149)
VLDL Cholesterol Cal: 17 mg/dL (ref 5–40)

## 2022-08-14 LAB — VITAMIN D 25 HYDROXY (VIT D DEFICIENCY, FRACTURES): Vit D, 25-Hydroxy: 9.5 ng/mL — ABNORMAL LOW (ref 30.0–100.0)

## 2022-08-14 LAB — TSH+FREE T4
Free T4: 0.99 ng/dL (ref 0.82–1.77)
TSH: 1.81 u[IU]/mL (ref 0.450–4.500)

## 2022-08-14 LAB — B12 AND FOLATE PANEL
Folate: 11.3 ng/mL (ref >3.0)
Vitamin B-12: 620 pg/mL (ref 232–1245)

## 2022-08-16 ENCOUNTER — Other Ambulatory Visit: Payer: Self-pay | Admitting: Internal Medicine

## 2022-08-16 DIAGNOSIS — E559 Vitamin D deficiency, unspecified: Secondary | ICD-10-CM

## 2022-08-16 MED ORDER — VITAMIN D (ERGOCALCIFEROL) 1.25 MG (50000 UNIT) PO CAPS: 50000.0000 [IU] | ORAL_CAPSULE | ORAL | 0 refills | Status: AC

## 2022-08-17 ENCOUNTER — Other Ambulatory Visit (HOSPITAL_COMMUNITY): Payer: Self-pay

## 2022-08-18 DIAGNOSIS — N529 Male erectile dysfunction, unspecified: Secondary | ICD-10-CM | POA: Insufficient documentation

## 2022-08-18 NOTE — Assessment & Plan Note (Addendum)
Persistent issue.  Additional labs ordered today to complete workup.

## 2022-08-18 NOTE — Assessment & Plan Note (Addendum)
He is currently prescribed valsartan-HCTZ 160-12.5 mg daily for treatment of hypertension.  His blood pressure today is 133/81.  No additional medication changes today.

## 2022-08-18 NOTE — Assessment & Plan Note (Signed)
Second shingles vaccine administered today 

## 2022-08-18 NOTE — Assessment & Plan Note (Signed)
Atorvastatin 40 mg daily was started at his appointment in late October due to poorly controlled HLD and an elevated 10-year ASCVD risk score. -Repeat lipid panel ordered today

## 2022-08-18 NOTE — Assessment & Plan Note (Signed)
Today he endorses difficulty with both achieving and maintaining erections.  He is interested in an as needed medication for ED. -Sildenafil 50 mg as needed prescribed today

## 2022-10-13 ENCOUNTER — Other Ambulatory Visit (HOSPITAL_COMMUNITY): Payer: Self-pay

## 2022-10-27 ENCOUNTER — Encounter: Payer: Self-pay | Admitting: Podiatry

## 2022-10-27 ENCOUNTER — Ambulatory Visit: Payer: Commercial Managed Care - PPO | Admitting: Podiatry

## 2022-10-27 DIAGNOSIS — M79674 Pain in right toe(s): Secondary | ICD-10-CM

## 2022-10-27 DIAGNOSIS — B351 Tinea unguium: Secondary | ICD-10-CM

## 2022-10-27 DIAGNOSIS — M79675 Pain in left toe(s): Secondary | ICD-10-CM | POA: Diagnosis not present

## 2022-10-27 NOTE — Progress Notes (Signed)
  Subjective:  Patient ID: William Barajas, male    DOB: Dec 07, 1955,  MRN: [id number]  William Barajas presents to clinic today for painful thick toenails that are difficult to trim. Pain interferes with ambulation. Aggravating factors include wearing enclosed shoe gear. Pain is relieved with periodic professional debridement.  Chief Complaint  Patient presents with   Nail Problem    [facility] PCP-Dixon, Doren Custard PCP [phone]   New problem(s): None.   PCP is Johnette Abraham, MD.  No Known Allergies  Review of Systems: Negative except as noted in the HPI.  Objective: No changes noted in today's physical examination. There were no vitals filed for this visit.  William Barajas is a pleasant 67 y.o. male WD, WN in NAD. AAO x 3.  Vascular Examination: Vascular status intact b/l with palpable pedal pulses. CFT immediate b/l. No edema. No pain with calf compression b/l. Skin temperature gradient WNL b/l.   Neurological Examination: Sensation grossly intact b/l with 10 gram monofilament. Vibratory sensation intact b/l.   Dermatological Examination: Pedal skin with normal turgor, texture and tone b/l. Toenails 1-5 b/l thick, discolored, elongated with subungual debris and pain on dorsal palpation.   Moderately dry skin scaly with odor noted b/l feet.  Musculoskeletal Examination: Muscle strength 5/5 to b/l LE. Plantarflexed metatarsals b/l feet.  Assessment/Plan: 1. Pain due to onychomycosis of toenails of both feet    -Patient was evaluated and treated. All patient's and/or POA's questions/concerns answered on today's visit. -Patient to continue soft, supportive shoe gear daily. -Toenails 1-5 b/l were debrided in length and girth with sterile nail nippers and dremel without iatrogenic bleeding.  -Patient/POA to call should there be question/concern in the interim.   Return in about 3 months (around 01/25/2023).  Marzetta Board, DPM

## 2022-11-05 ENCOUNTER — Encounter: Payer: Self-pay | Admitting: Internal Medicine

## 2022-11-05 ENCOUNTER — Ambulatory Visit: Payer: Commercial Managed Care - PPO | Admitting: Internal Medicine

## 2022-11-05 VITALS — BP 136/84 | HR 55 | Ht 66.0 in | Wt 190.6 lb

## 2022-11-05 DIAGNOSIS — E785 Hyperlipidemia, unspecified: Secondary | ICD-10-CM | POA: Diagnosis not present

## 2022-11-05 DIAGNOSIS — I10 Essential (primary) hypertension: Secondary | ICD-10-CM | POA: Diagnosis not present

## 2022-11-05 DIAGNOSIS — E559 Vitamin D deficiency, unspecified: Secondary | ICD-10-CM | POA: Diagnosis not present

## 2022-11-05 NOTE — Assessment & Plan Note (Signed)
Noted on labs from December 2023.  He has completed high-dose, weekly vitamin D supplementation. -Repeat vitamin D level ordered today

## 2022-11-05 NOTE — Assessment & Plan Note (Signed)
Currently prescribed atorvastatin 40 mg daily.  Lipid panel updated in December.  Total cholesterol 132 and LDL 62.  His 10-year ASCVD risk for today is 16.2%. -No medication changes today.  Continue atorvastatin 40 mg daily.

## 2022-11-05 NOTE — Patient Instructions (Signed)
It was a pleasure to see you today.  Thank you for giving [place] the opportunity to be involved in your care.  Below is a brief recap of your visit and next steps.  We will plan to see you again in 6 months.  Summary No medication changes today I have ordered a repeat vitamin D level and chemistry panel Follow up in 6 months

## 2022-11-05 NOTE — Progress Notes (Signed)
Established Patient Office Visit  Subjective   Patient ID: William Barajas, male    DOB: July 26, 1956  Age: 67 y.o. MRN: [id number]  Chief Complaint  Patient presents with   Follow-up    Follow up   William Barajas returns to care today.  He was last seen by me on 08/13/22 for routine follow-up.  At that time he continued to endorse fatigue and also requests an as needed medication for erectile dysfunction.  Sildenafil 50 mg as needed was prescribed.  There have been no acute interval events.  Mr. [name] reports feeling well today.  He is asymptomatic and has no acute concerns to discuss.  Past Medical History:  Diagnosis Date   Bursitis, shoulder 10/30/2012   Carpal tunnel syndrome on left 11/08/2012   Hypertension    Muscle weakness (generalized) 12/01/2012   Pain in joint, shoulder region 12/01/2012   Rotator cuff syndrome of left shoulder 12/01/2012   Past Surgical History:  Procedure Laterality Date   COLONOSCOPY WITH PROPOFOL N/A 03/13/2021   Procedure: COLONOSCOPY WITH PROPOFOL;  Surgeon: Eloise Harman, DO;  Location: [facility];  Service: Endoscopy;  Laterality: N/A;  ASA II /12:30   KNEE SURGERY Right    POLYPECTOMY  03/13/2021   Procedure: POLYPECTOMY;  Surgeon: Eloise Harman, DO;  Location: [facility];  Service: Endoscopy;;   Social History   Tobacco Use   Smoking status: Never   Smokeless tobacco: Never  Substance Use Topics   Alcohol use: No   Drug use: No   Family History  Problem Relation Age of Onset   Arthritis Mother    Bone cancer Mother    Cancer - Lung Father    Breast cancer Sister    Hypertension Sister    Hyperlipidemia Sister    Cancer Other    Asthma Other    Diabetes Other    Cancer - Prostate Neg Hx    Colon cancer Neg Hx    No Known Allergies  Review of Systems  Constitutional:  Negative for chills and fever.  HENT:  Negative for sore throat.   Respiratory:  Negative for cough and shortness of breath.   Cardiovascular:  Negative  for chest pain, palpitations and leg swelling.  Gastrointestinal:  Negative for abdominal pain, blood in stool, constipation, diarrhea, nausea and vomiting.  Genitourinary:  Negative for dysuria and hematuria.  Musculoskeletal:  Negative for myalgias.  Skin:  Negative for itching and rash.  Neurological:  Negative for dizziness and headaches.  Psychiatric/Behavioral:  Negative for depression and suicidal ideas.      Objective:     BP 136/84 (BP Location: Right Arm, Patient Position: Sitting, Cuff Size: Large)   Pulse (!) 55   Ht '5\' 6"'$  (1.676 m)   Wt 190 lb 9.6 oz (86.5 kg)   SpO2 96%   BMI 30.76 kg/m  BP Readings from Last 3 Encounters:  11/05/22 136/84  08/13/22 133/81  07/05/22 (!) 156/88   Physical Exam Vitals reviewed.  Constitutional:      General: He is not in acute distress.    Appearance: Normal appearance. He is not ill-appearing.  HENT:     Head: Normocephalic and atraumatic.     Nose: Nose normal. No congestion or rhinorrhea.     Mouth/Throat:     Mouth: Mucous membranes are moist.     Pharynx: Oropharynx is clear.  Eyes:     Extraocular Movements: Extraocular movements intact.     Conjunctiva/sclera: Conjunctivae  normal.     Pupils: Pupils are equal, round, and reactive to light.  Cardiovascular:     Rate and Rhythm: Normal rate and regular rhythm.     Pulses: Normal pulses.     Heart sounds: Normal heart sounds. No murmur heard. Pulmonary:     Effort: Pulmonary effort is normal.     Breath sounds: Normal breath sounds. No wheezing, rhonchi or rales.  Abdominal:     General: Abdomen is flat. Bowel sounds are normal. There is no distension.     Palpations: Abdomen is soft.     Tenderness: There is no abdominal tenderness.  Musculoskeletal:        General: No swelling or deformity. Normal range of motion.     Cervical back: Normal range of motion.  Skin:    General: Skin is warm and dry.     Capillary Refill: Capillary refill takes less than 2 seconds.   Neurological:     General: No focal deficit present.     Mental Status: He is alert and oriented to person, place, and time.     Motor: No weakness.  Psychiatric:        Mood and Affect: Mood normal.        Behavior: Behavior normal.        Thought Content: Thought content normal.   Last CBC Lab Results  Component Value Date   WBC 6.6 06/18/2022   HGB 15.5 06/18/2022   HCT 44.8 06/18/2022   MCV 88 06/18/2022   MCH 30.5 06/18/2022   RDW 12.8 06/18/2022   PLT 246 [id number]   Last metabolic panel Lab Results  Component Value Date   GLUCOSE 83 08/13/2022   NA 145 (H) 08/13/2022   K 4.3 08/13/2022   CL 103 08/13/2022   CO2 28 08/13/2022   BUN 24 08/13/2022   CREATININE 1.37 (H) 08/13/2022   EGFR 57 (L) 08/13/2022   CALCIUM 9.7 08/13/2022   PROT 7.0 06/18/2022   ALBUMIN 4.6 06/18/2022   LABGLOB 2.4 06/18/2022   AGRATIO 1.9 06/18/2022   BILITOT 0.4 06/18/2022   ALKPHOS 102 06/18/2022   AST 20 06/18/2022   ALT 22 06/18/2022   ANIONGAP 7 03/11/2021   Last lipids Lab Results  Component Value Date   CHOL 132 08/13/2022   HDL 53 08/13/2022   LDLCALC 62 08/13/2022   TRIG 91 08/13/2022   CHOLHDL 2.5 08/13/2022   Last hemoglobin A1c Lab Results  Component Value Date   HGBA1C 5.7 (H) 06/18/2022   Last thyroid functions Lab Results  Component Value Date   TSH 1.810 08/13/2022   Last vitamin D Lab Results  Component Value Date   VD25OH 9.5 (L) 08/13/2022   Last vitamin B12 and Folate Lab Results  Component Value Date   VITAMINB12 620 08/13/2022   FOLATE 11.3 08/13/2022   The 10-year ASCVD risk score (Arnett DK, et al., 2019) is: 16.2%    Assessment & Plan:   Problem List Items Addressed This Visit       Essential hypertension    Remains adequately controlled on valsartan-HCTZ 160-12.5 mg daily. -No medication changes today      Hyperlipidemia    Currently prescribed atorvastatin 40 mg daily.  Lipid panel updated in December.  Total cholesterol  132 and LDL 62.  His 10-year ASCVD risk for today is 16.2%. -No medication changes today.  Continue atorvastatin 40 mg daily.      Vitamin D deficiency - Primary    Noted  on labs from December 2023.  He has completed high-dose, weekly vitamin D supplementation. -Repeat vitamin D level ordered today      Return in about 6 months (around 05/08/2023).   Johnette Abraham, MD

## 2022-11-05 NOTE — Assessment & Plan Note (Signed)
Remains adequately controlled on valsartan-HCTZ 160-12.5 mg daily. -No medication changes today

## 2022-11-06 LAB — BASIC METABOLIC PANEL
BUN/Creatinine Ratio: 18 (ref 10–24)
BUN: 20 mg/dL (ref 8–27)
CO2: 26 mmol/L (ref 20–29)
Calcium: 9.3 mg/dL (ref 8.6–10.2)
Chloride: 104 mmol/L (ref 96–106)
Creatinine, Ser: 1.14 mg/dL (ref 0.76–1.27)
Glucose: 81 mg/dL (ref 70–99)
Potassium: 4.1 mmol/L (ref 3.5–5.2)
Sodium: 145 mmol/L — ABNORMAL HIGH (ref 134–144)
eGFR: 71 mL/min/{1.73_m2} (ref >59)

## 2022-11-06 LAB — VITAMIN D 25 HYDROXY (VIT D DEFICIENCY, FRACTURES): Vit D, 25-Hydroxy: 18.2 ng/mL — ABNORMAL LOW (ref 30.0–100.0)

## 2022-11-08 ENCOUNTER — Other Ambulatory Visit (HOSPITAL_COMMUNITY): Payer: Self-pay

## 2022-11-08 ENCOUNTER — Telehealth: Payer: Self-pay | Admitting: Internal Medicine

## 2022-11-08 ENCOUNTER — Other Ambulatory Visit: Payer: Self-pay | Admitting: Internal Medicine

## 2022-11-08 DIAGNOSIS — E559 Vitamin D deficiency, unspecified: Secondary | ICD-10-CM

## 2022-11-08 MED ORDER — VITAMIN D (ERGOCALCIFEROL) 1.25 MG (50000 UNIT) PO CAPS
50000.0000 [IU] | ORAL_CAPSULE | ORAL | 0 refills | Status: AC
  Filled 2022-11-08: qty 12, 84d supply, fill #0

## 2022-11-08 NOTE — Telephone Encounter (Signed)
Sunlight is the best natural source. Milk can often be fortified with vitamin D. Limiting sodium intake would be the best way to treat his sodium level.

## 2022-11-08 NOTE — Telephone Encounter (Signed)
Pt daughter called wanting to know if there is other ways to help his VIT D level & sodium besides taking medication?

## 2022-11-09 NOTE — Telephone Encounter (Signed)
Returned call and daughter aware.

## 2022-11-12 ENCOUNTER — Ambulatory Visit: Payer: Self-pay | Admitting: Internal Medicine

## 2023-01-17 ENCOUNTER — Other Ambulatory Visit (HOSPITAL_COMMUNITY): Payer: Self-pay

## 2023-02-09 ENCOUNTER — Ambulatory Visit: Payer: Medicare Other | Admitting: Podiatry

## 2023-03-04 ENCOUNTER — Encounter: Payer: Self-pay | Admitting: Emergency Medicine

## 2023-03-04 ENCOUNTER — Ambulatory Visit
Admission: EM | Admit: 2023-03-04 | Discharge: 2023-03-04 | Disposition: A | Discharge status: Home / Self Care | Payer: Commercial Managed Care - PPO | Attending: Family Medicine | Admitting: Family Medicine

## 2023-03-04 DIAGNOSIS — J069 Acute upper respiratory infection, unspecified: Secondary | ICD-10-CM | POA: Insufficient documentation

## 2023-03-04 DIAGNOSIS — Z1152 Encounter for screening for COVID-19: Secondary | ICD-10-CM | POA: Insufficient documentation

## 2023-03-04 MED ORDER — FLUTICASONE PROPIONATE 50 MCG/ACT NA SUSP: 1.0000 | Freq: Two times a day (BID) | NASAL | 2 refills | Status: AC

## 2023-03-04 MED ORDER — PROMETHAZINE-DM 6.25-15 MG/5ML PO SYRP: 5.0000 mL | ORAL_SOLUTION | Freq: Four times a day (QID) | ORAL | 0 refills | Status: DC | PRN

## 2023-03-04 NOTE — Discharge Instructions (Addendum)
We have tested you for COVID today, we only call with positive results so no news is good news.  We should have those results back by tomorrow.  I suspect her symptoms are from a viral upper respiratory infection.  You may continue taking over-the-counter cold and congestion medications such as Coricidin HBP and I have sent over a steroid nasal spray and good cough syrup to help as well.  Drink plenty of fluids, get lots of rest.

## 2023-03-04 NOTE — ED Triage Notes (Signed)
Cough and nasal congestion since Tuesday.  has been taking nyquil with some relief.

## 2023-03-04 NOTE — ED Provider Notes (Signed)
[facility]    CSN: [id number] Arrival date & time: 03/04/23  0810      History   Chief Complaint No chief complaint on file.   HPI William Barajas is a 67 y.o. male.   Patient presenting today with 3-day history of hacking cough, nasal congestion.  Denies fever, chills, chest pain, shortness of breath, abdominal pain, nausea vomiting or diarrhea.  No known sick contacts recently.  No known history of chronic pulmonary disease.  So far trying NyQuil with mild temporary benefit.    Past Medical History:  Diagnosis Date   Bursitis, shoulder 10/30/2012   Carpal tunnel syndrome on left 11/08/2012   Hypertension    Muscle weakness (generalized) 12/01/2012   Pain in joint, shoulder region 12/01/2012   Rotator cuff syndrome of left shoulder 12/01/2012    Patient Active Problem List   Diagnosis Date Noted   Vitamin D deficiency 11/05/2022   Erectile dysfunction 08/18/2022   Hyperlipidemia 07/05/2022   Prediabetes 07/05/2022   Fatigue 07/05/2022   Encounter for general adult medical examination with abnormal findings 06/23/2022   Need for shingles vaccine 05/21/2022   Need for influenza vaccination 05/21/2022   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 05/21/2022   Need for Tdap vaccination 02/11/2022   Essential hypertension 01/16/2021   BMI 30.0-30.9,adult 01/16/2021   Callus of foot 01/16/2021    Past Surgical History:  Procedure Laterality Date   COLONOSCOPY WITH PROPOFOL N/A 03/13/2021   Procedure: COLONOSCOPY WITH PROPOFOL;  Surgeon: Lanelle Bal, DO;  [facility];  Service: Endoscopy;  Laterality: N/A;  ASA II /12:30   KNEE SURGERY Right    POLYPECTOMY  03/13/2021   Procedure: POLYPECTOMY;  Surgeon: Lanelle Bal, DO;  Location: [facility];  Service: Endoscopy;;       Home Medications    Prior to Admission medications   Medication Sig Start Date End Date Taking? Authorizing Provider  fluticasone (FLONASE) 50 MCG/ACT nasal spray Place 1 spray  into both nostrils 2 (two) times daily. 03/04/23  Yes Particia Nearing, PA-C  promethazine-dextromethorphan (PROMETHAZINE-DM) 6.25-15 MG/5ML syrup Take 5 mLs by mouth 4 (four) times daily as needed. 03/04/23  Yes Particia Nearing, PA-C  atorvastatin (LIPITOR) 40 MG tablet Take 1 tablet (40 mg total) by mouth daily. 07/05/22   Billie Lade, MD  ciclopirox (LOPROX) 0.77 % cream Apply 1 application topically to both feet and between toes 2 (two) times daily. 02/12/22   Freddie Breech, DPM  sildenafil (VIAGRA) 50 MG tablet Take 1 tablet (50 mg total) by mouth daily as needed for erectile dysfunction. 08/13/22   Billie Lade, MD  valsartan-hydrochlorothiazide (DIOVAN-HCT) 160-12.5 MG tablet Take 1 tablet by mouth daily. 07/05/22   Billie Lade, MD  Vitamin D, Ergocalciferol, (DRISDOL) 1.25 MG (50000 UNIT) CAPS capsule Take 1 capsule (50,000 Units total) by mouth every 7 (seven) days. 11/08/22   Billie Lade, MD    Family History Family History  Problem Relation Age of Onset   Arthritis Mother    Bone cancer Mother    Cancer - Lung Father    Breast cancer Sister    Hypertension Sister    Hyperlipidemia Sister    Cancer Other    Asthma Other    Diabetes Other    Cancer - Prostate Neg Hx    Colon cancer Neg Hx     Social History Social History   Tobacco Use   Smoking status: Never  Smokeless tobacco: Never  Substance Use Topics   Alcohol use: No   Drug use: No     Allergies   Patient has no known allergies.   Review of Systems Review of Systems Per HPI  Physical Exam Triage Vital Signs ED Triage Vitals  Enc Vitals Group     BP 03/04/23 0838 (!) 146/87     Pulse Rate 03/04/23 0838 (!) 54     Resp 03/04/23 0838 18     Temp 03/04/23 0838 97.6 F (36.4 C)     Temp Source 03/04/23 0838 Oral     SpO2 03/04/23 0838 94 %     Weight --      Height --      Head Circumference --      Peak Flow --      Pain Score 03/04/23 0839 0     Pain Loc --       Pain Edu? --      Excl. in GC? --    No data found.  Updated Vital Signs BP (!) 146/87 (BP Location: Right Arm)   Pulse (!) 54   Temp 97.6 F (36.4 C) (Oral)   Resp 18   SpO2 94%   Visual Acuity Right Eye Distance:   Left Eye Distance:   Bilateral Distance:    Right Eye Near:   Left Eye Near:    Bilateral Near:     Physical Exam Vitals and nursing note reviewed.  Constitutional:      Appearance: He is well-developed.  HENT:     Head: Atraumatic.     Right Ear: External ear normal.     Left Ear: External ear normal.     Nose: Rhinorrhea present.     Mouth/Throat:     Pharynx: Posterior oropharyngeal erythema present. No oropharyngeal exudate.  Eyes:     Conjunctiva/sclera: Conjunctivae normal.     Pupils: Pupils are equal, round, and reactive to light.  Cardiovascular:     Rate and Rhythm: Normal rate and regular rhythm.  Pulmonary:     Effort: Pulmonary effort is normal. No respiratory distress.     Breath sounds: No wheezing or rales.  Musculoskeletal:        General: Normal range of motion.     Cervical back: Normal range of motion and neck supple.  Lymphadenopathy:     Cervical: No cervical adenopathy.  Skin:    General: Skin is warm and dry.  Neurological:     Mental Status: He is alert and oriented to person, place, and time.  Psychiatric:        Behavior: Behavior normal.      UC Treatments / Results  Labs (all labs ordered are listed, but only abnormal results are displayed) Labs Reviewed  SARS CORONAVIRUS 2 (TAT 6-24 HRS)    EKG   Radiology No results found.  Procedures Procedures (including critical care time)  Medications Ordered in UC Medications - No data to display  Initial Impression / Assessment and Plan / UC Course  I have reviewed the triage vital signs and the nursing notes.  Pertinent labs & imaging results that were available during my care of the patient were reviewed by me and considered in my medical decision making  (see chart for details).     Overall vital signs and exam reassuring today, suspicious for viral upper respiratory infection.  COVID testing pending, treat with Flonase, Phenergan DM, supportive over-the-counter medications and home care as discussed with patient.  Return for any worsening symptoms.  Final Clinical Impressions(s) / UC Diagnoses   Final diagnoses:  Viral URI with cough     Discharge Instructions      We have tested you for COVID today, we only call with positive results so no news is good news.  We should have those results back by tomorrow.  I suspect her symptoms are from a viral upper respiratory infection.  You may continue taking over-the-counter cold and congestion medications such as Coricidin HBP and I have sent over a steroid nasal spray and good cough syrup to help as well.  Drink plenty of fluids, get lots of rest.    ED Prescriptions     Medication Sig Dispense Auth. Provider   fluticasone (FLONASE) 50 MCG/ACT nasal spray Place 1 spray into both nostrils 2 (two) times daily. 16 g Roosvelt Maser [place], [place]   promethazine-dextromethorphan (PROMETHAZINE-DM) 6.25-15 MG/5ML syrup Take 5 mLs by mouth 4 (four) times daily as needed. 100 mL Particia Nearing, [place]      PDMP not reviewed this encounter.   Roosvelt Maser [place], [place] 03/04/23 [phone]

## 2023-03-05 LAB — SARS CORONAVIRUS 2 (TAT 6-24 HRS): SARS Coronavirus 2: NEGATIVE

## 2023-03-16 ENCOUNTER — Ambulatory Visit: Payer: Commercial Managed Care - PPO | Admitting: Podiatry

## 2023-03-16 ENCOUNTER — Encounter: Payer: Self-pay | Admitting: Podiatry

## 2023-03-16 VITALS — BP 157/87 | HR 103

## 2023-03-16 DIAGNOSIS — M79674 Pain in right toe(s): Secondary | ICD-10-CM

## 2023-03-16 DIAGNOSIS — B351 Tinea unguium: Secondary | ICD-10-CM

## 2023-03-16 DIAGNOSIS — M79675 Pain in left toe(s): Secondary | ICD-10-CM

## 2023-03-20 NOTE — Progress Notes (Signed)
  Subjective:  Patient ID: William Barajas, male    DOB: March 03, 1956,  MRN: [id number]  William Barajas presents to clinic today for painful thick toenails that are difficult to trim. Pain interferes with ambulation. Aggravating factors include wearing enclosed shoe gear. Pain is relieved with periodic professional debridement. He is accompanied by his daughter on today's visit. New problem(s): None.   PCP is Billie Lade, MD.  No Known Allergies  Review of Systems: Negative except as noted in the HPI.  Objective:  Vitals:   03/16/23 1558  BP: (!) 157/87  Pulse: (!) 103   William Barajas is a pleasant 67 y.o. male WD, WN in NAD. AAO x 3.  Vascular Examination: Vascular status intact b/l with palpable pedal pulses. CFT immediate b/l. No edema. No pain with calf compression b/l. Skin temperature gradient WNL b/l.   Neurological Examination: Sensation grossly intact b/l with 10 gram monofilament. Vibratory sensation intact b/l.   Dermatological Examination: Pedal skin with normal turgor, texture and tone b/l. Toenails 1-5 b/l thick, discolored, elongated with subungual debris and pain on dorsal palpation.   Musculoskeletal Examination: Muscle strength 5/5 to b/l LE. Plantarflexed metatarsals b/l feet. Assessment/Plan: 1. Pain due to onychomycosis of toenails of both feet    -Consent given for treatment as described below: -Examined patient. -Patient to continue soft, supportive shoe gear daily. -Mycotic toenails 1-5 bilaterally were debrided in length and girth with sterile nail nippers and dremel without incident. -Patient/POA to call should there be question/concern in the interim.   Return in about 3 months (around 06/16/2023).  Freddie Breech, DPM

## 2023-05-13 ENCOUNTER — Other Ambulatory Visit (HOSPITAL_COMMUNITY): Payer: Self-pay

## 2023-05-17 ENCOUNTER — Ambulatory Visit: Payer: Commercial Managed Care - PPO | Admitting: Internal Medicine

## 2023-05-18 ENCOUNTER — Ambulatory Visit: Payer: Commercial Managed Care - PPO | Admitting: Internal Medicine

## 2023-05-25 ENCOUNTER — Ambulatory Visit: Payer: Commercial Managed Care - PPO | Admitting: Internal Medicine

## 2023-06-22 ENCOUNTER — Ambulatory Visit: Payer: Commercial Managed Care - PPO | Admitting: Podiatry

## 2023-06-22 ENCOUNTER — Encounter: Payer: Self-pay | Admitting: Podiatry

## 2023-06-22 DIAGNOSIS — M79675 Pain in left toe(s): Secondary | ICD-10-CM | POA: Diagnosis not present

## 2023-06-22 DIAGNOSIS — Q828 Other specified congenital malformations of skin: Secondary | ICD-10-CM | POA: Diagnosis not present

## 2023-06-22 DIAGNOSIS — B351 Tinea unguium: Secondary | ICD-10-CM

## 2023-06-22 DIAGNOSIS — M79674 Pain in right toe(s): Secondary | ICD-10-CM

## 2023-06-22 DIAGNOSIS — M79671 Pain in right foot: Secondary | ICD-10-CM

## 2023-06-22 DIAGNOSIS — M79672 Pain in left foot: Secondary | ICD-10-CM | POA: Diagnosis not present

## 2023-06-22 NOTE — Progress Notes (Signed)
  Subjective:  Patient ID: William Barajas, male    DOB: [birth year],  MRN: [id number]  67 y.o. male presents painful porokeratotic lesion(s) of both feet and painful mycotic toenails that limit ambulation. Painful toenails interfere with ambulation. Aggravating factors include wearing enclosed shoe gear. Pain is relieved with periodic professional debridement. Painful porokeratotic lesions are aggravated when weightbearing with and without shoegear. Pain is relieved with periodic professional debridement. Chief Complaint  Patient presents with   RFC    RFC- Patient states feels burning under both feet    New problem(s): None   PCP is Billie Lade, MD , and last visit was November 05, 2022.  No Known Allergies  Review of Systems: Negative except as noted in the HPI.   Objective:  William Barajas is a pleasant 67 y.o. male WD, WN in NAD.Marland Kitchen AAO x 3.  Vascular Examination: Vascular status intact b/l with palpable pedal pulses. CFT immediate b/l. Pedal hair present. No edema. No pain with calf compression b/l. Skin temperature gradient WNL b/l. No varicosities noted. No cyanosis or clubbing noted.  Neurological Examination: Sensation grossly intact b/l with 10 gram monofilament. Vibratory sensation intact b/l.  Dermatological Examination: Pedal skin with normal turgor, texture and tone b/l. No open wounds nor interdigital macerations noted. Toenails 1-5 b/l thick, discolored, elongated with subungual debris and pain on dorsal palpation.  Porokeratotic lesion(s) submet head 2 right foot, 5th metatarsal left foot and distal tip left 3rd digit. No erythema, no edema, no drainage, no fluctuance.  Musculoskeletal Examination: Muscle strength 5/5 to b/l LE.  No pain, crepitus noted b/l. No gross pedal deformities. Patient ambulates independently without assistive aids.   Radiographs: None  Last A1c:       No data to display           Assessment:   1. Pain due to onychomycosis of  toenails of both feet   2. Porokeratosis   3. Pain in both feet    Plan:  -Patient was evaluated and treated. All patient's and/or POA's questions/concerns answered on today's visit. -Continue supportive shoe gear daily. -Toenails 1-5 b/l were debrided in length and girth with sterile nail nippers and dremel without iatrogenic bleeding.  -Porokeratotic lesion(s) distal tip of left 3rd toe, submet head 2 right foot, and 5th metatarsal head left foot pared and enucleated with sterile currette without incident. Total number of lesions debrided=3. -Patient/POA to call should there be question/concern in the interim.  Return in about 3 months (around 09/22/2023).  Freddie Breech, DPM

## 2023-07-01 ENCOUNTER — Ambulatory Visit: Payer: Commercial Managed Care - PPO | Admitting: Internal Medicine

## 2023-08-04 ENCOUNTER — Other Ambulatory Visit: Payer: Self-pay | Admitting: Internal Medicine

## 2023-08-04 DIAGNOSIS — E785 Hyperlipidemia, unspecified: Secondary | ICD-10-CM

## 2023-08-04 DIAGNOSIS — I10 Essential (primary) hypertension: Secondary | ICD-10-CM

## 2023-08-08 ENCOUNTER — Other Ambulatory Visit (HOSPITAL_COMMUNITY): Payer: Self-pay

## 2023-08-08 MED ORDER — VALSARTAN-HYDROCHLOROTHIAZIDE 160-12.5 MG PO TABS
1.0000 | ORAL_TABLET | Freq: Every day | ORAL | 3 refills | Status: DC
  Filled 2023-08-08: qty 90, 90d supply, fill #0
  Filled 2023-12-20: qty 90, 90d supply, fill #1
  Filled 2024-03-28: qty 90, 90d supply, fill #2
  Filled 2024-07-07: qty 90, 90d supply, fill #3

## 2023-08-08 MED ORDER — ATORVASTATIN CALCIUM 40 MG PO TABS
40.0000 mg | ORAL_TABLET | Freq: Every day | ORAL | 3 refills | Status: DC
  Filled 2023-08-08: qty 90, 90d supply, fill #0
  Filled 2023-12-20: qty 90, 90d supply, fill #1
  Filled 2024-03-28: qty 90, 90d supply, fill #2
  Filled 2024-07-07: qty 90, 90d supply, fill #3

## 2023-08-12 ENCOUNTER — Encounter: Payer: Self-pay | Admitting: Internal Medicine

## 2023-08-12 ENCOUNTER — Ambulatory Visit: Payer: Commercial Managed Care - PPO | Admitting: Internal Medicine

## 2023-08-12 VITALS — BP 128/78 | HR 62 | Ht 66.0 in | Wt 188.6 lb

## 2023-08-12 DIAGNOSIS — Z6830 Body mass index (BMI) 30.0-30.9, adult: Secondary | ICD-10-CM | POA: Diagnosis not present

## 2023-08-12 DIAGNOSIS — R7303 Prediabetes: Secondary | ICD-10-CM

## 2023-08-12 DIAGNOSIS — E785 Hyperlipidemia, unspecified: Secondary | ICD-10-CM

## 2023-08-12 DIAGNOSIS — E559 Vitamin D deficiency, unspecified: Secondary | ICD-10-CM | POA: Diagnosis not present

## 2023-08-12 DIAGNOSIS — I10 Essential (primary) hypertension: Secondary | ICD-10-CM

## 2023-08-12 NOTE — Assessment & Plan Note (Signed)
A1c 5.7 on labs from October.  Repeat A1c ordered today.

## 2023-08-12 NOTE — Assessment & Plan Note (Signed)
Currently prescribed atorvastatin 40 mg daily.  Lipid panel last updated in December 2023. -Repeat lipid panel ordered today

## 2023-08-12 NOTE — Assessment & Plan Note (Signed)
BP was elevated initially, but improved to 128/78.  Remains adequately controlled with valsartan-HCTZ 160-12.5 mg daily.  No medication changes are indicated today.

## 2023-08-12 NOTE — Progress Notes (Signed)
Established Patient Office Visit  Subjective   Patient ID: William Barajas, male    DOB: 09-08-1955  Age: 67 y.o. MRN: [id number]  Chief Complaint  Patient presents with   Hyperlipidemia    Six month follow up    Mr. William Barajas returns to care today for routine follow-up.  He was last evaluated by me on 3/1.  No medication changes were made at that time and 60-month follow-up was arranged.  In the interim, he presented to urgent care on 6/28 endorsing URI symptoms.  He has also been seen by podiatry.  There have otherwise been no acute interval events. Mr. William Barajas reports feeling well today.  He is asymptomatic currently.  His acute concern is requesting to be checked for inguinal hernias.  He has not appreciated any abnormal lumps or masses in his groin, but would like to be checked for reassurance.  Past Medical History:  Diagnosis Date   Bursitis, shoulder 10/30/2012   Carpal tunnel syndrome on left 11/08/2012   Hypertension    Muscle weakness (generalized) 12/01/2012   Pain in joint, shoulder region 12/01/2012   Rotator cuff syndrome of left shoulder 12/01/2012   Past Surgical History:  Procedure Laterality Date   COLONOSCOPY WITH PROPOFOL N/A 03/13/2021   Procedure: COLONOSCOPY WITH PROPOFOL;  Surgeon: Lanelle Bal, DO;  [facility]: [facility];  Service: Endoscopy;  Laterality: N/A;  ASA II /12:30   KNEE SURGERY Right    POLYPECTOMY  03/13/2021   Procedure: POLYPECTOMY;  Surgeon: Lanelle Bal, DO;  Location: [facility];  Service: Endoscopy;;   Social History   Tobacco Use   Smoking status: Never   Smokeless tobacco: Never  Substance Use Topics   Alcohol use: No   Drug use: No   Family History  Problem Relation Age of Onset   Arthritis Mother    Bone cancer Mother    Cancer - Lung Father    Breast cancer Sister    Hypertension Sister    Hyperlipidemia Sister    Cancer Other    Asthma Other    Diabetes Other    Cancer - Prostate Neg Hx    Colon cancer Neg Hx    No  Known Allergies  Review of Systems  Constitutional:  Negative for chills and fever.  HENT:  Negative for sore throat.   Respiratory:  Negative for cough and shortness of breath.   Cardiovascular:  Negative for chest pain, palpitations and leg swelling.  Gastrointestinal:  Negative for abdominal pain, blood in stool, constipation, diarrhea, nausea and vomiting.  Genitourinary:  Negative for dysuria and hematuria.  Musculoskeletal:  Negative for myalgias.  Skin:  Negative for itching and rash.  Neurological:  Negative for dizziness and headaches.  Psychiatric/Behavioral:  Negative for depression and suicidal ideas.      Objective:     BP 128/78   Pulse 62   Ht 5\' 6"  (1.676 m)   Wt 188 lb 9.6 oz (85.5 kg)   SpO2 96%   BMI 30.44 kg/m  BP Readings from Last 3 Encounters:  08/12/23 128/78  03/16/23 (!) 157/87  03/04/23 (!) 146/87   Physical Exam Vitals reviewed.  Constitutional:      General: He is not in acute distress.    Appearance: Normal appearance. He is not ill-appearing.  HENT:     Head: Normocephalic and atraumatic.     Nose: Nose normal. No congestion or rhinorrhea.     Mouth/Throat:     Mouth:  Mucous membranes are moist.     Pharynx: Oropharynx is clear.  Eyes:     Extraocular Movements: Extraocular movements intact.     Conjunctiva/sclera: Conjunctivae normal.     Pupils: Pupils are equal, round, and reactive to light.  Cardiovascular:     Rate and Rhythm: Normal rate and regular rhythm.     Pulses: Normal pulses.     Heart sounds: Normal heart sounds. No murmur heard. Pulmonary:     Effort: Pulmonary effort is normal.     Breath sounds: Normal breath sounds. No wheezing, rhonchi or rales.  Abdominal:     General: Abdomen is flat. Bowel sounds are normal. There is no distension.     Palpations: Abdomen is soft.     Tenderness: There is no abdominal tenderness.  Musculoskeletal:        General: No swelling or deformity. Normal range of motion.      Cervical back: Normal range of motion.  Skin:    General: Skin is warm and dry.     Capillary Refill: Capillary refill takes less than 2 seconds.  Neurological:     General: No focal deficit present.     Mental Status: He is alert and oriented to person, place, and time.     Motor: No weakness.  Psychiatric:        Mood and Affect: Mood normal.        Behavior: Behavior normal.        Thought Content: Thought content normal.   Last CBC Lab Results  Component Value Date   WBC 6.6 06/18/2022   HGB 15.5 06/18/2022   HCT 44.8 06/18/2022   MCV 88 06/18/2022   MCH 30.5 06/18/2022   RDW 12.8 06/18/2022   PLT 246 06/18/2022   Last metabolic panel Lab Results  Component Value Date   GLUCOSE 81 11/05/2022   NA 145 (H) 11/05/2022   K 4.1 11/05/2022   CL 104 11/05/2022   CO2 26 11/05/2022   BUN 20 11/05/2022   CREATININE 1.14 11/05/2022   EGFR 71 11/05/2022   CALCIUM 9.3 11/05/2022   PROT 7.0 06/18/2022   ALBUMIN 4.6 06/18/2022   LABGLOB 2.4 06/18/2022   AGRATIO 1.9 06/18/2022   BILITOT 0.4 06/18/2022   ALKPHOS 102 06/18/2022   AST 20 06/18/2022   ALT 22 06/18/2022   ANIONGAP 7 03/11/2021   Last lipids Lab Results  Component Value Date   CHOL 132 08/13/2022   HDL 53 08/13/2022   LDLCALC 62 08/13/2022   TRIG 91 08/13/2022   CHOLHDL 2.5 08/13/2022   Last hemoglobin A1c Lab Results  Component Value Date   HGBA1C 5.7 (H) 06/18/2022   Last thyroid functions Lab Results  Component Value Date   TSH 1.810 08/13/2022   Last vitamin D Lab Results  Component Value Date   VD25OH 18.2 (L) 11/05/2022   Last vitamin B12 and Folate Lab Results  Component Value Date   VITAMINB12 620 08/13/2022   FOLATE 11.3 08/13/2022   The 10-year ASCVD risk score (Arnett DK, et al., 2019) is: 14.6%    Assessment & Plan:   Problem List Items Addressed This Visit       Essential hypertension - Primary    BP was elevated initially, but improved to 128/78.  Remains adequately  controlled with valsartan-HCTZ 160-12.5 mg daily.  No medication changes are indicated today.      Hyperlipidemia    Currently prescribed atorvastatin 40 mg daily.  Lipid panel last updated  in December 2023. -Repeat lipid panel ordered today      Prediabetes    A1c 5.7 on labs from October.  Repeat A1c ordered today.      Vitamin D deficiency    Noted on previous labs.  He has completed high-dose, weekly vitamin D supplementation.  Repeat vitamin D level ordered today.      Return in about 6 months (around 02/10/2024).   Billie Lade, MD

## 2023-08-12 NOTE — Assessment & Plan Note (Signed)
Noted on previous labs.  He has completed high-dose, weekly vitamin D supplementation. -Repeat vitamin D level ordered today 

## 2023-08-12 NOTE — Patient Instructions (Signed)
It was a pleasure to see you today.  Thank you for giving [place] the opportunity to be involved in your care.  Below is a brief recap of your visit and next steps.  We will plan to see you again in 6 months.  Summary No medication changes today No hernia present on exam Repeat labs ordered Follow up in 6 months

## 2023-08-13 LAB — B12 AND FOLATE PANEL
Folate: 6.1 ng/mL (ref >3.0)
Vitamin B-12: 589 pg/mL (ref 232–1245)

## 2023-08-13 LAB — CMP14+EGFR
ALT: 26 [IU]/L (ref 0–44)
AST: 25 [IU]/L (ref 0–40)
Albumin: 4.1 g/dL (ref 3.9–4.9)
Alkaline Phosphatase: 102 [IU]/L (ref 44–121)
BUN/Creatinine Ratio: 18 (ref 10–24)
BUN: 21 mg/dL (ref 8–27)
Bilirubin Total: 0.4 mg/dL (ref 0.0–1.2)
CO2: 26 mmol/L (ref 20–29)
Calcium: 9.2 mg/dL (ref 8.6–10.2)
Chloride: 102 mmol/L (ref 96–106)
Creatinine, Ser: 1.17 mg/dL (ref 0.76–1.27)
Globulin, Total: 2.3 g/dL (ref 1.5–4.5)
Glucose: 87 mg/dL (ref 70–99)
Potassium: 4 mmol/L (ref 3.5–5.2)
Sodium: 142 mmol/L (ref 134–144)
Total Protein: 6.4 g/dL (ref 6.0–8.5)
eGFR: 69 mL/min/{1.73_m2} (ref >59)

## 2023-08-13 LAB — VITAMIN D 25 HYDROXY (VIT D DEFICIENCY, FRACTURES): Vit D, 25-Hydroxy: 21.3 ng/mL — ABNORMAL LOW (ref 30.0–100.0)

## 2023-08-13 LAB — CBC WITH DIFFERENTIAL/PLATELET
Basophils Absolute: 0 10*3/uL (ref 0.0–0.2)
Basos: 0 %
EOS (ABSOLUTE): 0.2 10*3/uL (ref 0.0–0.4)
Eos: 4 %
Hematocrit: 42.7 % (ref 37.5–51.0)
Hemoglobin: 14.3 g/dL (ref 13.0–17.7)
Immature Grans (Abs): 0 10*3/uL (ref 0.0–0.1)
Immature Granulocytes: 0 %
Lymphocytes Absolute: 1 10*3/uL (ref 0.7–3.1)
Lymphs: 18 %
MCH: 30.1 pg (ref 26.6–33.0)
MCHC: 33.5 g/dL (ref 31.5–35.7)
MCV: 90 fL (ref 79–97)
Monocytes Absolute: 0.6 10*3/uL (ref 0.1–0.9)
Monocytes: 10 %
Neutrophils Absolute: 3.8 10*3/uL (ref 1.4–7.0)
Neutrophils: 68 %
Platelets: 229 10*3/uL (ref 150–450)
RBC: 4.75 x10E6/uL (ref 4.14–5.80)
RDW: 12.8 % (ref 11.6–15.4)
WBC: 5.6 10*3/uL (ref 3.4–10.8)

## 2023-08-13 LAB — LIPID PANEL
Chol/HDL Ratio: 2.4 {ratio} (ref 0.0–5.0)
Cholesterol, Total: 125 mg/dL (ref 100–199)
HDL: 53 mg/dL (ref >39)
LDL Chol Calc (NIH): 62 mg/dL (ref 0–99)
Triglycerides: 42 mg/dL (ref 0–149)
VLDL Cholesterol Cal: 10 mg/dL (ref 5–40)

## 2023-08-13 LAB — TSH+FREE T4
Free T4: 0.99 ng/dL (ref 0.82–1.77)
TSH: 2.21 u[IU]/mL (ref 0.450–4.500)

## 2023-08-13 LAB — HEMOGLOBIN A1C
Est. average glucose Bld gHb Est-mCnc: 117 mg/dL
Hgb A1c MFr Bld: 5.7 % — ABNORMAL HIGH (ref 4.8–5.6)

## 2023-08-15 ENCOUNTER — Telehealth: Payer: Self-pay

## 2023-08-15 NOTE — Telephone Encounter (Signed)
Called patient back and gave information on dosage for vitamin D. Per Dr.Dixon he is to take 2000 international units every day. Patient understood and had no other questions.

## 2023-08-15 NOTE — Telephone Encounter (Signed)
-----   Message from Billie Lade sent at 08/15/2023 12:30 PM EST ----- 2000 international units. Thank you. ----- Message ----- From: Efraim Kaufmann, CMA Sent: 08/15/2023  11:31 AM EST To: Billie Lade, MD  Mr. Tlatelpa, is wanting to know how many international units of vitamin D supplement he should take?. Per his daughter he has taken 1500 international units in the past. ----- Message ----- From: Billie Lade, MD Sent: 08/14/2023  11:01 AM EST To: [facility]  Labs reviewed.  He is vitamin D insufficient.  Recommend starting daily vitamin D supplementation.  This can be purchased at his pharmacy.  A1c remains in the prediabetic range.  Labs are otherwise stable/within normal limits.

## 2023-09-03 DIAGNOSIS — H524 Presbyopia: Secondary | ICD-10-CM | POA: Diagnosis not present

## 2023-09-03 DIAGNOSIS — H5203 Hypermetropia, bilateral: Secondary | ICD-10-CM | POA: Diagnosis not present

## 2023-09-03 DIAGNOSIS — H52223 Regular astigmatism, bilateral: Secondary | ICD-10-CM | POA: Diagnosis not present

## 2023-09-28 ENCOUNTER — Ambulatory Visit: Payer: Commercial Managed Care - PPO | Admitting: Podiatry

## 2023-09-28 ENCOUNTER — Encounter: Payer: Self-pay | Admitting: Podiatry

## 2023-09-28 VITALS — Ht 66.0 in | Wt 188.6 lb

## 2023-09-28 DIAGNOSIS — M79675 Pain in left toe(s): Secondary | ICD-10-CM | POA: Diagnosis not present

## 2023-09-28 DIAGNOSIS — M79674 Pain in right toe(s): Secondary | ICD-10-CM | POA: Diagnosis not present

## 2023-09-28 DIAGNOSIS — B351 Tinea unguium: Secondary | ICD-10-CM | POA: Diagnosis not present

## 2023-09-28 DIAGNOSIS — D2372 Other benign neoplasm of skin of left lower limb, including hip: Secondary | ICD-10-CM

## 2023-09-28 NOTE — Progress Notes (Signed)
   Chief Complaint  Patient presents with   Nail Problem    Pt is here for RFC.    SUBJECTIVE Patient presents to office today complaining of elongated, thickened nails that cause pain while ambulating in shoes.  Patient is unable to trim their own nails.  Patient also developed symptomatic skin lesion and to the bilateral feet they are routinely debrided.  Patient is here for further evaluation and treatment.  Past Medical History:  Diagnosis Date   Bursitis, shoulder 10/30/2012   Carpal tunnel syndrome on left 11/08/2012   Hypertension    Muscle weakness (generalized) 12/01/2012   Pain in joint, shoulder region 12/01/2012   Rotator cuff syndrome of left shoulder 12/01/2012    No Known Allergies   OBJECTIVE General Patient is awake, alert, and oriented x 3 and in no acute distress. Derm Skin is dry and supple bilateral. Negative open lesions or macerations. Remaining integument unremarkable. Nails are tender, long, thickened and dystrophic with subungual debris, consistent with onychomycosis, 1-5 bilateral. No signs of infection noted.  Hyperkeratotic skin lesions noted to the bilateral feet with a central nucleated core and associated tenderness with palpation Vasc  DP and PT pedal pulses palpable bilaterally. Temperature gradient within normal limits.  Neuro grossly intact via light touch Musculoskeletal Exam No symptomatic pedal deformities noted bilateral. Muscular strength within normal limits.  ASSESSMENT 1.  Pain due to onychomycosis of toenails both 2.  Eccrine poroma bilateral  PLAN OF CARE -Patient evaluated today.  -Instructed to maintain good pedal hygiene and foot care.  -Mechanical debridement of nails 1-5 bilaterally performed using a nail nipper. Filed with dremel without incident.  -Excisional debridement of the hyperkeratotic eccrine poroma callus tissue was performed today using a 312 scalpel without incident or bleeding.  Salicylic acid and Band-Aid  applied. -Return to clinic in 3 mos.    Felecia Shelling, DPM [facility]  Dr. Felecia Shelling, DPM    [address] [place], [place] [address]                Office [phone]  Fax [phone]

## 2023-10-05 ENCOUNTER — Ambulatory Visit (INDEPENDENT_AMBULATORY_CARE_PROVIDER_SITE_OTHER): Payer: Commercial Managed Care - PPO | Admitting: Podiatry

## 2024-01-18 ENCOUNTER — Ambulatory Visit: Payer: Commercial Managed Care - PPO | Admitting: Podiatry

## 2024-01-18 DIAGNOSIS — M79671 Pain in right foot: Secondary | ICD-10-CM

## 2024-01-18 DIAGNOSIS — M79675 Pain in left toe(s): Secondary | ICD-10-CM

## 2024-01-18 DIAGNOSIS — M79674 Pain in right toe(s): Secondary | ICD-10-CM

## 2024-01-18 DIAGNOSIS — Q828 Other specified congenital malformations of skin: Secondary | ICD-10-CM | POA: Diagnosis not present

## 2024-01-18 DIAGNOSIS — M79672 Pain in left foot: Secondary | ICD-10-CM | POA: Diagnosis not present

## 2024-01-18 DIAGNOSIS — B351 Tinea unguium: Secondary | ICD-10-CM | POA: Diagnosis not present

## 2024-01-18 NOTE — Progress Notes (Signed)
  Subjective:  Patient ID: William Barajas, male    DOB: 02-07-1956,  MRN: [id number]  William Barajas presents to clinic today for painful porokeratotic lesion(s) right foot and painful mycotic toenails that limit ambulation. Painful toenails interfere with ambulation. Aggravating factors include wearing enclosed shoe gear. Pain is relieved with periodic professional debridement. Painful porokeratotic lesions are aggravated when weightbearing with and without shoegear. Pain is relieved with periodic professional debridement.  Chief Complaint  Patient presents with   Nail Problem    RFC   New problem(s): None.   PCP is Tobi Fortes, MD.  No Known Allergies  Review of Systems: Negative except as noted in the HPI.  Objective: No changes noted in today's physical examination. There were no vitals filed for this visit. William Barajas is a pleasant 68 y.o. male in NAD. AAO x 3.  Vascular Examination: Vascular status intact b/l with palpable pedal pulses. CFT immediate b/l. Pedal hair present. No edema. No pain with calf compression b/l. Skin temperature gradient WNL b/l. No varicosities noted. No cyanosis or clubbing noted.  Neurological Examination: Sensation grossly intact b/l with 10 gram monofilament. Vibratory sensation intact b/l.  Dermatological Examination: Pedal skin with normal turgor, texture and tone b/l. No open wounds nor interdigital macerations noted. Toenails 1-5 b/l thick, discolored, elongated with subungual debris and pain on dorsal palpation.  Porokeratotic lesion(s) submet head 2 right foot, No erythema, no edema, no drainage, no fluctuance.  Musculoskeletal Examination: Muscle strength 5/5 to b/l LE.  No pain, crepitus noted b/l. No gross pedal deformities. Patient ambulates independently without assistive aids.   Radiographs: None  Assessment/Plan: 1. Pain due to onychomycosis of toenails of both feet   2. Porokeratosis   3. Pain in both feet    Consent given for treatment. Patient examined. All patient's and/or POA's questions/concerns addressed on today's visit. Mycotic toenails 1-5 debrided in length and girth without incident. Porokeratotic lesion(s) submet head 2 right foot pared and enucleated with sharp debridement without incident.Continue soft, supportive shoe gear daily. Report any pedal injuries to medical professional. Call office if there are any quesitons/concerns. -Patient/POA to call should there be question/concern in the interim.   Return in about 3 months (around 04/19/2024).  Luella Sager, DPM      [place] LOCATION: [address] [place] [address]                   Office [phone]   [facility] LOCATION: [address] [place], [place] [address] Office [phone]

## 2024-01-22 ENCOUNTER — Encounter: Payer: Self-pay | Admitting: Podiatry

## 2024-02-10 ENCOUNTER — Ambulatory Visit: Payer: Commercial Managed Care - PPO | Admitting: Internal Medicine

## 2024-02-15 ENCOUNTER — Encounter: Payer: Self-pay | Admitting: Internal Medicine

## 2024-02-15 ENCOUNTER — Ambulatory Visit: Admitting: Internal Medicine

## 2024-02-15 VITALS — BP 128/64 | HR 63 | Ht 66.0 in | Wt 188.6 lb

## 2024-02-15 DIAGNOSIS — E559 Vitamin D deficiency, unspecified: Secondary | ICD-10-CM | POA: Diagnosis not present

## 2024-02-15 DIAGNOSIS — R7303 Prediabetes: Secondary | ICD-10-CM | POA: Diagnosis not present

## 2024-02-15 DIAGNOSIS — I10 Essential (primary) hypertension: Secondary | ICD-10-CM | POA: Diagnosis not present

## 2024-02-15 DIAGNOSIS — E785 Hyperlipidemia, unspecified: Secondary | ICD-10-CM | POA: Diagnosis not present

## 2024-02-15 NOTE — Progress Notes (Signed)
 Established Patient Office Visit  Subjective   Patient ID: William Barajas, male    DOB: [birth year]  Age: 68 y.o. MRN: [id number]  Chief Complaint  Patient presents with   Hypertension    Six month follow up    William Barajas returns to care today for routine follow-up.  He was last evaluated by me in December 2024.  No medication changes were made at that time, repeat labs ordered, and 71-month follow-up arranged.  He has been evaluated by podiatry for follow-up in the interim.  There have otherwise been no acute interval events.  Today he reports feeling well and has no acute concerns to discuss.  Past Medical History:  Diagnosis Date   Bursitis, shoulder 10/30/2012   Carpal tunnel syndrome on left 11/08/2012   Hypertension    Muscle weakness (generalized) 12/01/2012   Pain in joint, shoulder region 12/01/2012   Rotator cuff syndrome of left shoulder 12/01/2012   Past Surgical History:  Procedure Laterality Date   COLONOSCOPY WITH PROPOFOL  N/A 03/13/2021   Procedure: COLONOSCOPY WITH PROPOFOL ;  Surgeon: Vinetta Greening, DO;  Location: [facility];  Service: Endoscopy;  Laterality: N/A;  ASA II /12:30   KNEE SURGERY Right    POLYPECTOMY  03/13/2021   Procedure: POLYPECTOMY;  Surgeon: Vinetta Greening, DO;  Location: [facility];  Service: Endoscopy;;   Social History   Tobacco Use   Smoking status: Never   Smokeless tobacco: Never  Substance Use Topics   Alcohol use: No   Drug use: No   Family History  Problem Relation Age of Onset   Arthritis Mother    Bone cancer Mother    Cancer - Lung Father    Breast cancer Sister    Hypertension Sister    Hyperlipidemia Sister    Cancer Other    Asthma Other    Diabetes Other    Cancer - Prostate Neg Hx    Colon cancer Neg Hx    No Known Allergies  Review of Systems  Constitutional:  Negative for chills and fever.  HENT:  Negative for sore throat.   Respiratory:  Negative for cough and shortness of breath.    Cardiovascular:  Negative for chest pain, palpitations and leg swelling.  Gastrointestinal:  Negative for abdominal pain, blood in stool, constipation, diarrhea, nausea and vomiting.  Genitourinary:  Negative for dysuria and hematuria.  Musculoskeletal:  Negative for myalgias.  Skin:  Negative for itching and rash.  Neurological:  Negative for dizziness and headaches.  Psychiatric/Behavioral:  Negative for depression and suicidal ideas.      Objective:     BP 128/64   Pulse 63   Ht 5' 6 (1.676 m)   Wt 188 lb 9.6 oz (85.5 kg)   SpO2 97%   BMI 30.44 kg/m  BP Readings from Last 3 Encounters:  02/15/24 128/64  08/12/23 128/78  03/16/23 (!) 157/87   Physical Exam Vitals reviewed.  Constitutional:      General: He is not in acute distress.    Appearance: Normal appearance. He is not ill-appearing.  HENT:     Head: Normocephalic and atraumatic.     Right Ear: External ear normal.     Left Ear: External ear normal.     Nose: Nose normal. No congestion or rhinorrhea.     Mouth/Throat:     Mouth: Mucous membranes are moist.     Pharynx: Oropharynx is clear.   Eyes:     General: No  scleral icterus.    Extraocular Movements: Extraocular movements intact.     Conjunctiva/sclera: Conjunctivae normal.     Pupils: Pupils are equal, round, and reactive to light.    Cardiovascular:     Rate and Rhythm: Normal rate and regular rhythm.     Pulses: Normal pulses.     Heart sounds: Normal heart sounds. No murmur heard. Pulmonary:     Effort: Pulmonary effort is normal.     Breath sounds: Normal breath sounds. No wheezing, rhonchi or rales.  Abdominal:     General: Abdomen is flat. Bowel sounds are normal. There is no distension.     Palpations: Abdomen is soft.     Tenderness: There is no abdominal tenderness.   Musculoskeletal:        General: No swelling or deformity. Normal range of motion.     Cervical back: Normal range of motion.   Skin:    General: Skin is warm and  dry.     Capillary Refill: Capillary refill takes less than 2 seconds.   Neurological:     General: No focal deficit present.     Mental Status: He is alert and oriented to person, place, and time.     Motor: No weakness.   Psychiatric:        Mood and Affect: Mood normal.        Behavior: Behavior normal.        Thought Content: Thought content normal.   Last CBC Lab Results  Component Value Date   WBC 5.6 08/12/2023   HGB 14.3 08/12/2023   HCT 42.7 08/12/2023   MCV 90 08/12/2023   MCH 30.1 08/12/2023   RDW 12.8 08/12/2023   PLT 229 08/12/2023   Last metabolic panel Lab Results  Component Value Date   GLUCOSE 87 08/12/2023   NA 142 08/12/2023   K 4.0 08/12/2023   CL 102 08/12/2023   CO2 26 08/12/2023   BUN 21 08/12/2023   CREATININE 1.17 08/12/2023   EGFR 69 08/12/2023   CALCIUM  9.2 08/12/2023   PROT 6.4 08/12/2023   ALBUMIN 4.1 08/12/2023   LABGLOB 2.3 08/12/2023   AGRATIO 1.9 06/18/2022   BILITOT 0.4 08/12/2023   ALKPHOS 102 08/12/2023   AST 25 08/12/2023   ALT 26 08/12/2023   ANIONGAP 7 03/11/2021   Last lipids Lab Results  Component Value Date   CHOL 125 08/12/2023   HDL 53 08/12/2023   LDLCALC 62 08/12/2023   TRIG 42 08/12/2023   CHOLHDL 2.4 08/12/2023   Last hemoglobin A1c Lab Results  Component Value Date   HGBA1C 5.7 (H) 08/12/2023   Last thyroid  functions Lab Results  Component Value Date   TSH 2.210 08/12/2023   Last vitamin D  Lab Results  Component Value Date   VD25OH 21.3 (L) 08/12/2023   Last vitamin B12 and Folate Lab Results  Component Value Date   VITAMINB12 589 08/12/2023   FOLATE 6.1 08/12/2023     Assessment & Plan:   Problem List Items Addressed This Visit       Essential hypertension - Primary   Remains adequately controlled on current antihypertensive regimen.      Hyperlipidemia   Lipid panel updated in December reflects adequate control with atorvastatin  40 mg daily.      Prediabetes   A1c 5.7 on labs  from December.  He remains focused on dietary changes in an effort to further reduce his blood sugar.      Vitamin D  insufficiency  Noted on labs from December.  He has started taking daily vitamin D  supplementation.      Return in about 6 months (around 08/16/2024) for CPE.   Tobi Fortes, MD

## 2024-02-15 NOTE — Patient Instructions (Signed)
It was a pleasure to see you today.  Thank you for giving [place] the opportunity to be involved in your care.  Below is a brief recap of your visit and next steps.  We will plan to see you again in 6 months  Summary No medication changes today Follow up in 6 months

## 2024-02-16 NOTE — Assessment & Plan Note (Signed)
 Lipid panel updated in December reflects adequate control with atorvastatin  40 mg daily.

## 2024-02-16 NOTE — Assessment & Plan Note (Signed)
 A1c 5.7 on labs from December.  He remains focused on dietary changes in an effort to further reduce his blood sugar.

## 2024-02-16 NOTE — Assessment & Plan Note (Signed)
 Remains adequately controlled on current antihypertensive regimen.

## 2024-02-16 NOTE — Assessment & Plan Note (Signed)
 Noted on labs from December.  He has started taking daily vitamin D  supplementation.

## 2024-02-17 ENCOUNTER — Ambulatory Visit: Admitting: Internal Medicine

## 2024-05-02 ENCOUNTER — Ambulatory Visit (INDEPENDENT_AMBULATORY_CARE_PROVIDER_SITE_OTHER): Admitting: Podiatry

## 2024-05-02 ENCOUNTER — Encounter: Payer: Self-pay | Admitting: Podiatry

## 2024-05-02 DIAGNOSIS — M79674 Pain in right toe(s): Secondary | ICD-10-CM

## 2024-05-02 DIAGNOSIS — M79675 Pain in left toe(s): Secondary | ICD-10-CM | POA: Diagnosis not present

## 2024-05-02 DIAGNOSIS — B351 Tinea unguium: Secondary | ICD-10-CM | POA: Diagnosis not present

## 2024-05-02 DIAGNOSIS — M79672 Pain in left foot: Secondary | ICD-10-CM

## 2024-05-02 DIAGNOSIS — L84 Corns and callosities: Secondary | ICD-10-CM

## 2024-05-05 NOTE — Progress Notes (Signed)
  Subjective:  Patient ID: William Barajas, male    DOB: 05/22/1956,  MRN: [id number]  William Barajas presents to clinic today for callus(es) left foot and painful mycotic toenails that are difficult to trim. Painful toenails interfere with ambulation. Aggravating factors include wearing enclosed shoe gear. Pain is relieved with periodic professional debridement. Painful calluses are aggravated when weightbearing with and without shoegear. Pain is relieved with periodic professional debridement.  Chief Complaint  Patient presents with   RFC    RFC Non diabetic toenail trim. LOV with PCP 02/15/24.   New problem(s): None.   PCP is Melvenia Manus BRAVO, MD.  No Known Allergies  Review of Systems: Negative except as noted in the HPI.  Objective:  There were no vitals filed for this visit. William Barajas is a pleasant 68 y.o. male in NAD. AAO x 3.  Vascular Examination: Vascular status intact b/l with palpable pedal pulses. CFT immediate b/l. Pedal hair present. No edema. No pain with calf compression b/l. Skin temperature gradient WNL b/l. No varicosities noted. No cyanosis or clubbing noted.  Neurological Examination: Sensation grossly intact b/l with 10 gram monofilament. Vibratory sensation intact b/l.  Dermatological Examination: Pedal skin with normal turgor, texture and tone b/l. No open wounds nor interdigital macerations noted. Toenails 1-5 b/l thick, discolored, elongated with subungual debris and pain on dorsal palpation.  Hyperkeratotic lesion(s) submet head 5 left foot. No erythema, no edema, no drainage, no fluctuance.  Musculoskeletal Examination: Muscle strength 5/5 to b/l LE.  No pain, crepitus noted b/l. No gross pedal deformities. Patient ambulates independently without assistive aids.   Radiographs: None  Assessment/Plan: 1. Pain due to onychomycosis of toenails of both feet   2. Callus   3. Pain in left foot   Patient was evaluated and treated. All patient's  and/or POA's questions/concerns addressed on today's visit. Toenails 1-5 debrided in length and girth without incident. Callus(es) submet head 5 left foot pared with sharp debridement without incident. Treatment was provided by assistant William Barajas under my supervision. Continue soft, supportive shoe gear daily. Report any pedal injuries to medical professional. Call office if there are any questions/concerns.  Return in about 3 months (around 08/02/2024).  William Barajas, DPM      [place] LOCATION: [address], [place] [address]                   Office [phone]   [facility] LOCATION: [address] [place], [place] [address] Office [phone]

## 2024-08-15 ENCOUNTER — Other Ambulatory Visit (HOSPITAL_COMMUNITY): Payer: Self-pay

## 2024-08-15 ENCOUNTER — Ambulatory Visit: Admitting: Podiatry

## 2024-08-15 ENCOUNTER — Encounter: Payer: Self-pay | Admitting: Podiatry

## 2024-08-15 DIAGNOSIS — L853 Xerosis cutis: Secondary | ICD-10-CM | POA: Insufficient documentation

## 2024-08-15 DIAGNOSIS — M79672 Pain in left foot: Secondary | ICD-10-CM | POA: Diagnosis not present

## 2024-08-15 DIAGNOSIS — Q828 Other specified congenital malformations of skin: Secondary | ICD-10-CM | POA: Diagnosis not present

## 2024-08-15 DIAGNOSIS — B351 Tinea unguium: Secondary | ICD-10-CM | POA: Diagnosis not present

## 2024-08-15 DIAGNOSIS — M79671 Pain in right foot: Secondary | ICD-10-CM | POA: Diagnosis not present

## 2024-08-15 DIAGNOSIS — M79675 Pain in left toe(s): Secondary | ICD-10-CM | POA: Diagnosis not present

## 2024-08-15 DIAGNOSIS — M79674 Pain in right toe(s): Secondary | ICD-10-CM | POA: Diagnosis not present

## 2024-08-15 MED ORDER — AMMONIUM LACTATE 12 % EX LOTN
1.0000 | TOPICAL_LOTION | CUTANEOUS | 5 refills | Status: AC | PRN
  Filled 2024-08-15: qty 226, 30d supply, fill #0

## 2024-08-17 ENCOUNTER — Encounter

## 2024-08-23 NOTE — Progress Notes (Signed)
 Subjective:  Patient ID: William Barajas, male    DOB: 1956/02/02,  MRN: [id number]  William Barajas presents to clinic today for painful porokeratotic lesion(s) of both feet and painful mycotic toenails that limit ambulation. Painful toenails interfere with ambulation. Aggravating factors include wearing enclosed shoe gear. Pain is relieved with periodic professional debridement. Painful porokeratotic lesions are aggravated when weightbearing with and without shoegear. Pain is relieved with periodic professional debridement.  Chief Complaint  Patient presents with   Rfc    Rm17 Routine foot care/ Dr. Manus William last visit June 2025   New problem(s): None.   PCP is William Manus BRAVO, MD.  Allergies[1]  Review of Systems: Negative except as noted in the HPI.  Objective:  There were no vitals filed for this visit. William Barajas is a pleasant 68 y.o. male WD, WN in NAD. AAO x 3.  Vascular Examination: Vascular status intact b/l with palpable pedal pulses. CFT immediate b/l. Pedal hair present. No edema. No pain with calf compression b/l. Skin temperature gradient WNL b/l. No varicosities noted. No cyanosis or clubbing noted.  Neurological Examination: Sensation grossly intact b/l with 10 gram monofilament. Vibratory sensation intact b/l.  Dermatological Examination: Pedal skin is noted to be dry b/l. No open wounds nor interdigital macerations noted. Toenails 1-5 b/l thick, discolored, elongated with subungual debris and pain on dorsal palpation.  Hyperkeratotic lesion(s) submet head 5 left foot. No erythema, no edema, no drainage, no fluctuance.  Musculoskeletal Examination: Muscle strength 5/5 to b/l LE.  No pain, crepitus noted b/l. No gross pedal deformities. Patient ambulates independently without assistive aids.   Radiographs: None  Assessment/Plan: 1. Pain due to onychomycosis of toenails of both feet   2. Porokeratosis   3. Pain in both feet   4. Xerosis cutis      Meds ordered this encounter  Medications   ammonium lactate  (LAC-HYDRIN ) 12 % lotion    Sig: Apply 1 Application topically as needed for dry skin.    Dispense:  400 g    Refill:  5  Consent given for treatment. Patient examined. All patient's and/or POA's questions/concerns addressed on today's visit. Mycotic toenails 1-5 b/l debrided in length and girth without incident. Continue foot and shoe inspections daily. Monitor blood glucose per PCP/Endocrinologist's recommendations.Continue soft, supportive shoe gear daily. Report any pedal injuries to medical professional. Call office if there are any quesitons/concerns. -Mycotic toenails 1-5 bilaterally were debrided in length and girth with sterile nail nippers and dremel without incident. -Porokeratotic lesion(s) submet head 2 right foot and submet head 5 left foot pared and enucleated with sterile currette without incident. Total number of lesions debrided=2. -For xerosis, Rx sent for Ammonium Lactate  Lotion 12%. Apply to feet twice daily avoiding application between toes. -Patient/POA to call should there be question/concern in the interim.   Return in about 3 months (around 11/13/2024).  Delon LITTIE Merlin, DPM      [place] LOCATION: [address] [place] [address]                   Office [phone]   [facility] LOCATION: [address] [place], [place] [address] Office [phone]     [  1] No Known Allergies

## 2024-08-31 ENCOUNTER — Encounter

## 2024-09-07 ENCOUNTER — Ambulatory Visit

## 2024-09-07 VITALS — BP 181/101 | HR 56 | Ht 66.0 in | Wt 196.0 lb

## 2024-09-07 DIAGNOSIS — Z0001 Encounter for general adult medical examination with abnormal findings: Secondary | ICD-10-CM

## 2024-09-07 DIAGNOSIS — E559 Vitamin D deficiency, unspecified: Secondary | ICD-10-CM

## 2024-09-07 DIAGNOSIS — I10 Essential (primary) hypertension: Secondary | ICD-10-CM

## 2024-09-07 DIAGNOSIS — Z125 Encounter for screening for malignant neoplasm of prostate: Secondary | ICD-10-CM

## 2024-09-07 DIAGNOSIS — R7303 Prediabetes: Secondary | ICD-10-CM

## 2024-09-07 NOTE — Progress Notes (Signed)
 "  Complete physical exam  Patient: William Barajas   DOB: [birth year]   69 y.o. Male  MRN: [id number]  Subjective:    Chief Complaint  Patient presents with   Establish Care    William Barajas is a 69 y.o. male who presents today for a complete physical exam. He reports consuming a general diet. The patient has a physically strenuous job, but has no regular exercise apart from work.  He generally feels well. He reports sleeping well. He does have additional problems to discuss today.    Most recent fall risk assessment:    09/07/2024    3:20 PM  Fall Risk   Falls in the past year? 0  Number falls in past yr: 0  Injury with Fall? 0  Risk for fall due to : No Fall Risks  Follow up Falls evaluation completed     Most recent depression screenings:    09/07/2024    3:20 PM 02/15/2024    4:00 PM  PHQ 2/9 Scores  PHQ - 2 Score  0  PHQ- 9 Score  0   Exception Documentation Patient refusal      Data saved with a previous flowsheet row definition      Patient Active Problem List   Diagnosis Date Noted   Xerosis cutis 08/15/2024   Vitamin D  insufficiency 11/05/2022   Erectile dysfunction 08/18/2022   Hyperlipidemia 07/05/2022   Prediabetes 07/05/2022   Fatigue 07/05/2022   Encounter for general adult medical examination with abnormal findings 06/23/2022   Need for shingles vaccine 05/21/2022   Need for influenza vaccination 05/21/2022   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 05/21/2022   Need for Tdap vaccination 02/11/2022   Essential hypertension 01/16/2021   BMI 30.0-30.9,adult 01/16/2021      Patient Care Team: Melvenia Manus BRAVO, MD as PCP - General (Internal Medicine) Cindie Carlin POUR, DO as Consulting Physician (Gastroenterology)   Show/hide medication list[1]  ROS     Objective:     BP (!) 181/101   Pulse (!) 56   Ht 5' 6 (1.676 m)   Wt 196 lb (88.9 kg)   SpO2 95%   BMI 31.64 kg/m  BP Readings from Last 3 Encounters:  09/07/24 (!) 181/101  02/15/24  128/64  08/12/23 128/78   Wt Readings from Last 3 Encounters:  09/07/24 196 lb (88.9 kg)  02/15/24 188 lb 9.6 oz (85.5 kg)  09/28/23 188 lb 9.6 oz (85.5 kg)     Physical Exam Vitals and nursing note reviewed.  Constitutional:      Appearance: Normal appearance. He is obese.  HENT:     Head: Normocephalic.     Right Ear: Tympanic membrane, ear canal and external ear normal.     Left Ear: Tympanic membrane, ear canal and external ear normal.     Nose: Nose normal.     Mouth/Throat:     Mouth: Mucous membranes are moist.     Pharynx: Oropharynx is clear.  Eyes:     Extraocular Movements: Extraocular movements intact.     Pupils: Pupils are equal, round, and reactive to light.  Cardiovascular:     Rate and Rhythm: Normal rate and regular rhythm.  Pulmonary:     Effort: Pulmonary effort is normal.     Breath sounds: Normal breath sounds.  Abdominal:     General: Abdomen is flat. Bowel sounds are normal.     Palpations: Abdomen is soft.  Musculoskeletal:  General: Normal range of motion.     Cervical back: Normal range of motion and neck supple.  Skin:    General: Skin is warm and dry.  Neurological:     Mental Status: He is alert and oriented to person, place, and time.  Psychiatric:        Mood and Affect: Mood normal.        Thought Content: Thought content normal.           Assessment & Plan:    Routine Health Maintenance and Physical Exam  Immunization History  Administered Date(s) Administered   Fluad Quad(high Dose 65+) 05/21/2022, 06/27/2023   Moderna SARS-COV2 Booster Vaccination 12/17/2020   Moderna Sars-Covid-2 Vaccination 10/02/2019, 10/30/2019   PNEUMOCOCCAL CONJUGATE-20 06/18/2022   Tdap 02/11/2022   Zoster Recombinant(Shingrix ) 05/21/2022, 08/13/2022    Health Maintenance  Topic Date Due   COVID-19 Vaccine (3 - Moderna risk series) 01/14/2021   Influenza Vaccine  04/06/2024   Colonoscopy  03/14/2031   DTaP/Tdap/Td (2 - Td or Tdap)  02/12/2032   Pneumococcal Vaccine: 50+ Years  Completed   Hepatitis C Screening  Completed   Zoster Vaccines- Shingrix   Completed   Meningococcal B Vaccine  Aged Out    Discussed health benefits of physical activity, and encouraged him to engage in regular exercise appropriate for his age and condition.  Problem List Items Addressed This Visit       Cardiovascular and Mediastinum   Essential hypertension   Blood pressure elevated at 174/94 mmHg. Missed last dose of antihypertensive medication. Weight gain may contribute to elevation. - Rechecked blood pressure. - Continue current antihypertensive medication regimen. - Ordered blood work to assess overall health status.        Other   Encounter for general adult medical examination with abnormal findings - Primary   Presenting today for his annual physical exam. -Repeat labs ordered       Relevant Orders   CMP14+EGFR (Completed)   CBC (Completed)   Lipid Profile (Completed)   TSH + free T4 (Completed)   HgB A1c (Completed)   Vitamin D  (25 hydroxy) (Completed)   Prediabetes   A1c 5.7 on labs from December.  He remains focused on dietary changes in an effort to further reduce his blood sugar. Recheck A1C today.       Relevant Orders   HgB A1c (Completed)   Vitamin D  insufficiency   Noted on labs from December.  He has started taking daily vitamin D  supplementation. Recheck levels today.        Relevant Orders   Vitamin D  (25 hydroxy) (Completed)   Other Visit Diagnoses       Prostate cancer screening       Relevant Orders   PSA (Completed)     The natural history of prostate cancer and ongoing controversy regarding screening and potential treatment outcomes of prostate cancer has been discussed with the patient. The meaning of a false positive PSA and a false negative PSA has been discussed. He indicates understanding of the limitations of this screening test and wishes to proceed with screening PSA  testing.  Return in about 6 months (around 03/07/2025) for chronic follow-up with PCP.    Leita Longs, FNP      [1]  Outpatient Medications Prior to Visit  Medication Sig   ammonium lactate  (LAC-HYDRIN ) 12 % lotion Apply 1 Application topically as needed for dry skin.   atorvastatin  (LIPITOR) 40 MG tablet Take 1 tablet (40 mg total) by  mouth daily.   ciclopirox  (LOPROX ) 0.77 % cream Apply 1 application topically to both feet and between toes 2 (two) times daily.   fluticasone  (FLONASE ) 50 MCG/ACT nasal spray Place 1 spray into both nostrils 2 (two) times daily.   sildenafil  (VIAGRA ) 50 MG tablet Take 1 tablet (50 mg total) by mouth daily as needed for erectile dysfunction.   valsartan -hydrochlorothiazide  (DIOVAN -HCT) 160-12.5 MG tablet Take 1 tablet by mouth daily.   Vitamin D , Ergocalciferol , (DRISDOL ) 1.25 MG (50000 UNIT) CAPS capsule Take 1 capsule (50,000 Units total) by mouth every 7 (seven) days.   [DISCONTINUED] promethazine -dextromethorphan (PROMETHAZINE -DM) 6.25-15 MG/5ML syrup Take 5 mLs by mouth 4 (four) times daily as needed.   No facility-administered medications prior to visit.   "

## 2024-09-08 LAB — CBC
Hematocrit: 43.9 % (ref 37.5–51.0)
Hemoglobin: 14.8 g/dL (ref 13.0–17.7)
MCH: 30.5 pg (ref 26.6–33.0)
MCHC: 33.7 g/dL (ref 31.5–35.7)
MCV: 91 fL (ref 79–97)
Platelets: 246 x10E3/uL (ref 150–450)
RBC: 4.85 x10E6/uL (ref 4.14–5.80)
RDW: 12.7 % (ref 11.6–15.4)
WBC: 7.1 x10E3/uL (ref 3.4–10.8)

## 2024-09-08 LAB — PSA: Prostate Specific Ag, Serum: 2.6 ng/mL (ref 0.0–4.0)

## 2024-09-08 LAB — CMP14+EGFR
ALT: 18 IU/L (ref 0–44)
AST: 20 IU/L (ref 0–40)
Albumin: 4.2 g/dL (ref 3.9–4.9)
Alkaline Phosphatase: 104 IU/L (ref 47–123)
BUN/Creatinine Ratio: 15 (ref 10–24)
BUN: 19 mg/dL (ref 8–27)
Bilirubin Total: 0.3 mg/dL (ref 0.0–1.2)
CO2: 24 mmol/L (ref 20–29)
Calcium: 9.6 mg/dL (ref 8.6–10.2)
Chloride: 107 mmol/L — ABNORMAL HIGH (ref 96–106)
Creatinine, Ser: 1.28 mg/dL — ABNORMAL HIGH (ref 0.76–1.27)
Globulin, Total: 2.3 g/dL (ref 1.5–4.5)
Glucose: 75 mg/dL (ref 70–99)
Potassium: 4.2 mmol/L (ref 3.5–5.2)
Sodium: 145 mmol/L — ABNORMAL HIGH (ref 134–144)
Total Protein: 6.5 g/dL (ref 6.0–8.5)
eGFR: 61 mL/min/1.73

## 2024-09-08 LAB — LIPID PANEL
Chol/HDL Ratio: 2.6 ratio (ref 0.0–5.0)
Cholesterol, Total: 151 mg/dL (ref 100–199)
HDL: 57 mg/dL
LDL Chol Calc (NIH): 79 mg/dL (ref 0–99)
Triglycerides: 75 mg/dL (ref 0–149)
VLDL Cholesterol Cal: 15 mg/dL (ref 5–40)

## 2024-09-08 LAB — TSH+FREE T4
Free T4: 0.89 ng/dL (ref 0.82–1.77)
TSH: 2.28 u[IU]/mL (ref 0.450–4.500)

## 2024-09-08 LAB — HEMOGLOBIN A1C
Est. average glucose Bld gHb Est-mCnc: 117 mg/dL
Hgb A1c MFr Bld: 5.7 % — ABNORMAL HIGH (ref 4.8–5.6)

## 2024-09-08 LAB — VITAMIN D 25 HYDROXY (VIT D DEFICIENCY, FRACTURES): Vit D, 25-Hydroxy: 21.5 ng/mL — ABNORMAL LOW (ref 30.0–100.0)

## 2024-09-09 NOTE — Assessment & Plan Note (Signed)
 Noted on labs from December.  He has started taking daily vitamin D  supplementation. Recheck levels today.

## 2024-09-09 NOTE — Assessment & Plan Note (Signed)
 Blood pressure elevated at 174/94 mmHg. Missed last dose of antihypertensive medication. Weight gain may contribute to elevation. - Rechecked blood pressure. - Continue current antihypertensive medication regimen. - Ordered blood work to assess overall health status.

## 2024-09-09 NOTE — Assessment & Plan Note (Signed)
 Presenting today for his annual physical exam. -Repeat labs ordered

## 2024-09-09 NOTE — Assessment & Plan Note (Signed)
 A1c 5.7 on labs from December.  He remains focused on dietary changes in an effort to further reduce his blood sugar. Recheck A1C today.

## 2024-09-10 ENCOUNTER — Telehealth: Payer: Self-pay

## 2024-09-10 ENCOUNTER — Other Ambulatory Visit: Payer: Self-pay

## 2024-09-10 DIAGNOSIS — E559 Vitamin D deficiency, unspecified: Secondary | ICD-10-CM

## 2024-09-10 DIAGNOSIS — N529 Male erectile dysfunction, unspecified: Secondary | ICD-10-CM

## 2024-09-10 DIAGNOSIS — E785 Hyperlipidemia, unspecified: Secondary | ICD-10-CM

## 2024-09-10 DIAGNOSIS — I10 Essential (primary) hypertension: Secondary | ICD-10-CM

## 2024-09-10 NOTE — Telephone Encounter (Signed)
 Daughter advised.

## 2024-09-10 NOTE — Telephone Encounter (Signed)
 Copied from CRM [id number]. Topic: Clinical - Medication Question >> Sep 07, 2024  4:44 PM Roselie BROCKS wrote: Reason for CRM: Patient daughter, Glenys Bihari, calling to confim what type of vitamin the patient was told to start taking at the visit on 10-08-24, and requests a return call.

## 2024-09-10 NOTE — Telephone Encounter (Signed)
 I just recommended an over 50 multivitamin for men.  No brand in particular.

## 2024-10-08 ENCOUNTER — Other Ambulatory Visit: Payer: Self-pay | Admitting: Internal Medicine

## 2024-10-08 DIAGNOSIS — I10 Essential (primary) hypertension: Secondary | ICD-10-CM

## 2024-10-08 DIAGNOSIS — E785 Hyperlipidemia, unspecified: Secondary | ICD-10-CM

## 2024-10-09 ENCOUNTER — Other Ambulatory Visit (HOSPITAL_COMMUNITY): Payer: Self-pay

## 2024-10-09 MED ORDER — ATORVASTATIN CALCIUM 40 MG PO TABS
40.0000 mg | ORAL_TABLET | Freq: Every day | ORAL | 3 refills | Status: AC
  Filled 2024-10-09: qty 90, 90d supply, fill #0

## 2024-10-09 MED ORDER — VALSARTAN-HYDROCHLOROTHIAZIDE 160-12.5 MG PO TABS
1.0000 | ORAL_TABLET | Freq: Every day | ORAL | 3 refills | Status: AC
  Filled 2024-10-09: qty 90, 90d supply, fill #0

## 2024-11-28 ENCOUNTER — Ambulatory Visit: Admitting: Podiatry

## 2025-03-11 ENCOUNTER — Ambulatory Visit: Payer: Self-pay

## 2025-03-15 ENCOUNTER — Ambulatory Visit: Payer: Self-pay
